# Patient Record
Sex: Male | Born: 1940 | Race: White | Hispanic: No | Marital: Married | Smoking: Former smoker
Health system: Southern US, Community
[De-identification: ages and names within clinical notes are randomized; demographics above are authoritative.]

## PROBLEM LIST (undated history)

## (undated) DIAGNOSIS — I350 Nonrheumatic aortic (valve) stenosis: Secondary | ICD-10-CM

## (undated) DIAGNOSIS — T7840XA Allergy, unspecified, initial encounter: Secondary | ICD-10-CM

## (undated) DIAGNOSIS — I471 Supraventricular tachycardia, unspecified: Secondary | ICD-10-CM

## (undated) DIAGNOSIS — K219 Gastro-esophageal reflux disease without esophagitis: Secondary | ICD-10-CM

## (undated) DIAGNOSIS — I4891 Unspecified atrial fibrillation: Secondary | ICD-10-CM

## (undated) DIAGNOSIS — K222 Esophageal obstruction: Secondary | ICD-10-CM

## (undated) DIAGNOSIS — E785 Hyperlipidemia, unspecified: Secondary | ICD-10-CM

## (undated) DIAGNOSIS — H409 Unspecified glaucoma: Secondary | ICD-10-CM

## (undated) DIAGNOSIS — M1990 Unspecified osteoarthritis, unspecified site: Secondary | ICD-10-CM

## (undated) DIAGNOSIS — I10 Essential (primary) hypertension: Secondary | ICD-10-CM

## (undated) DIAGNOSIS — K449 Diaphragmatic hernia without obstruction or gangrene: Secondary | ICD-10-CM

## (undated) DIAGNOSIS — H3320 Serous retinal detachment, unspecified eye: Secondary | ICD-10-CM

## (undated) DIAGNOSIS — C61 Malignant neoplasm of prostate: Secondary | ICD-10-CM

## (undated) DIAGNOSIS — R5383 Other fatigue: Secondary | ICD-10-CM

## (undated) DIAGNOSIS — Z9889 Other specified postprocedural states: Secondary | ICD-10-CM

## (undated) DIAGNOSIS — I447 Left bundle-branch block, unspecified: Secondary | ICD-10-CM

## (undated) DIAGNOSIS — I2510 Atherosclerotic heart disease of native coronary artery without angina pectoris: Secondary | ICD-10-CM

## (undated) DIAGNOSIS — I493 Ventricular premature depolarization: Secondary | ICD-10-CM

## (undated) DIAGNOSIS — C801 Malignant (primary) neoplasm, unspecified: Secondary | ICD-10-CM

## (undated) HISTORY — DX: Diaphragmatic hernia without obstruction or gangrene: K44.9

## (undated) HISTORY — DX: Atherosclerotic heart disease of native coronary artery without angina pectoris: I25.10

## (undated) HISTORY — DX: Left bundle-branch block, unspecified: I44.7

## (undated) HISTORY — DX: Allergy, unspecified, initial encounter: T78.40XA

## (undated) HISTORY — PX: HERNIA REPAIR: SHX51

## (undated) HISTORY — DX: Nonrheumatic aortic (valve) stenosis: I35.0

## (undated) HISTORY — DX: Hyperlipidemia, unspecified: E78.5

## (undated) HISTORY — DX: Other specified postprocedural states: Z98.890

## (undated) HISTORY — DX: Supraventricular tachycardia: I47.1

## (undated) HISTORY — PX: OTHER SURGICAL HISTORY: SHX169

## (undated) HISTORY — DX: Gastro-esophageal reflux disease without esophagitis: K21.9

## (undated) HISTORY — DX: Supraventricular tachycardia, unspecified: I47.10

## (undated) HISTORY — DX: Other fatigue: R53.83

## (undated) HISTORY — DX: Ventricular premature depolarization: I49.3

## (undated) HISTORY — PX: CARDIAC ELECTROPHYSIOLOGY STUDY AND ABLATION: SHX1294

## (undated) HISTORY — DX: Esophageal obstruction: K22.2

## (undated) HISTORY — DX: Unspecified osteoarthritis, unspecified site: M19.90

## (undated) HISTORY — DX: Malignant (primary) neoplasm, unspecified: C80.1

## (undated) HISTORY — DX: Malignant neoplasm of prostate: C61

---

## 1999-10-07 ENCOUNTER — Inpatient Hospital Stay (HOSPITAL_COMMUNITY): Admission: EM | Admit: 1999-10-07 | Discharge: 1999-10-09 | Payer: Self-pay | Admitting: Emergency Medicine

## 1999-10-07 ENCOUNTER — Encounter: Payer: Self-pay | Admitting: Neurology

## 1999-10-07 ENCOUNTER — Encounter: Payer: Self-pay | Admitting: Emergency Medicine

## 1999-10-08 ENCOUNTER — Encounter: Payer: Self-pay | Admitting: Emergency Medicine

## 1999-10-23 ENCOUNTER — Ambulatory Visit (HOSPITAL_COMMUNITY): Admission: RE | Admit: 1999-10-23 | Discharge: 1999-10-23 | Payer: Self-pay | Admitting: Cardiovascular Disease

## 2002-08-16 ENCOUNTER — Observation Stay (HOSPITAL_COMMUNITY): Admission: EM | Admit: 2002-08-16 | Discharge: 2002-08-16 | Payer: Self-pay | Admitting: Emergency Medicine

## 2003-07-28 ENCOUNTER — Ambulatory Visit (HOSPITAL_COMMUNITY): Admission: RE | Admit: 2003-07-28 | Discharge: 2003-07-28 | Payer: Self-pay | Admitting: Cardiology

## 2003-07-28 HISTORY — PX: CARDIAC CATHETERIZATION: SHX172

## 2003-07-29 ENCOUNTER — Emergency Department (HOSPITAL_COMMUNITY): Admission: EM | Admit: 2003-07-29 | Discharge: 2003-07-29 | Payer: Self-pay | Admitting: Emergency Medicine

## 2004-01-07 DIAGNOSIS — K449 Diaphragmatic hernia without obstruction or gangrene: Secondary | ICD-10-CM

## 2004-01-07 DIAGNOSIS — K222 Esophageal obstruction: Secondary | ICD-10-CM

## 2004-01-07 HISTORY — DX: Diaphragmatic hernia without obstruction or gangrene: K44.9

## 2004-01-07 HISTORY — DX: Esophageal obstruction: K22.2

## 2004-01-18 ENCOUNTER — Ambulatory Visit: Payer: Self-pay | Admitting: Gastroenterology

## 2004-01-29 ENCOUNTER — Ambulatory Visit: Payer: Self-pay | Admitting: Gastroenterology

## 2004-02-28 ENCOUNTER — Ambulatory Visit (HOSPITAL_COMMUNITY): Admission: RE | Admit: 2004-02-28 | Discharge: 2004-02-28 | Payer: Self-pay | Admitting: Surgery

## 2004-02-28 ENCOUNTER — Ambulatory Visit (HOSPITAL_BASED_OUTPATIENT_CLINIC_OR_DEPARTMENT_OTHER): Admission: RE | Admit: 2004-02-28 | Discharge: 2004-02-28 | Payer: Self-pay | Admitting: Surgery

## 2007-04-22 ENCOUNTER — Ambulatory Visit: Payer: Self-pay | Admitting: Vascular Surgery

## 2007-09-25 ENCOUNTER — Ambulatory Visit: Payer: Self-pay | Admitting: Cardiovascular Disease

## 2007-09-25 ENCOUNTER — Emergency Department (HOSPITAL_COMMUNITY): Admission: EM | Admit: 2007-09-25 | Discharge: 2007-09-26 | Payer: Self-pay | Admitting: Emergency Medicine

## 2008-01-27 ENCOUNTER — Inpatient Hospital Stay (HOSPITAL_BASED_OUTPATIENT_CLINIC_OR_DEPARTMENT_OTHER): Admission: RE | Admit: 2008-01-27 | Discharge: 2008-01-27 | Payer: Self-pay | Admitting: Cardiology

## 2008-01-27 HISTORY — PX: CARDIAC CATHETERIZATION: SHX172

## 2008-06-08 ENCOUNTER — Encounter: Admission: RE | Admit: 2008-06-08 | Discharge: 2008-06-08 | Payer: Self-pay | Admitting: Family Medicine

## 2008-10-11 ENCOUNTER — Encounter: Admission: RE | Admit: 2008-10-11 | Discharge: 2008-12-06 | Payer: Self-pay | Admitting: Orthopedic Surgery

## 2008-12-19 ENCOUNTER — Encounter (INDEPENDENT_AMBULATORY_CARE_PROVIDER_SITE_OTHER): Payer: Self-pay | Admitting: *Deleted

## 2009-08-20 ENCOUNTER — Ambulatory Visit: Payer: Self-pay | Admitting: Cardiology

## 2009-08-20 ENCOUNTER — Ambulatory Visit (HOSPITAL_COMMUNITY): Admission: RE | Admit: 2009-08-20 | Discharge: 2009-08-20 | Payer: Self-pay | Admitting: Cardiology

## 2009-08-20 HISTORY — PX: US ECHOCARDIOGRAPHY: HXRAD669

## 2009-08-21 ENCOUNTER — Telehealth: Payer: Self-pay | Admitting: Gastroenterology

## 2010-02-05 NOTE — Progress Notes (Signed)
Summary: Schedule Colonoscopy  Phone Note Outgoing Call Call back at [facility] Phone [phone]   Call placed by: Harlow Mares CMA Duncan Dull),  August 21, 2009 4:45 PM Call placed to: Patient Summary of Call: Left message on patients machine to call back. patient is due for a colonoscopy Initial call taken by: Harlow Mares CMA Duncan Dull),  August 21, 2009 4:45 PM  Follow-up for Phone Call        spoke to the patient and he will call back to schedule his colonoscopy, he has to arrange a driver.  Follow-up by: Harlow Mares CMA (AAMA),  August 27, 2009 10:08 AM

## 2010-02-27 ENCOUNTER — Ambulatory Visit (INDEPENDENT_AMBULATORY_CARE_PROVIDER_SITE_OTHER): Payer: 59 | Admitting: Cardiology

## 2010-02-27 ENCOUNTER — Encounter: Payer: Self-pay | Admitting: Cardiology

## 2010-02-27 DIAGNOSIS — I359 Nonrheumatic aortic valve disorder, unspecified: Secondary | ICD-10-CM

## 2010-02-27 DIAGNOSIS — I447 Left bundle-branch block, unspecified: Secondary | ICD-10-CM

## 2010-04-22 LAB — POCT I-STAT 3, VENOUS BLOOD GAS (G3P V)
Acid-Base Excess: 1 mmol/L (ref 0.0–2.0)
Bicarbonate: 27.1 mEq/L — ABNORMAL HIGH (ref 20.0–24.0)
O2 Saturation: 67 %
TCO2: 28 mmol/L (ref 0–100)
pCO2, Ven: 46.5 mmHg (ref 45.0–50.0)
pH, Ven: 7.373 — ABNORMAL HIGH (ref 7.250–7.300)
pO2, Ven: 36 mmHg (ref 30.0–45.0)

## 2010-04-22 LAB — POCT I-STAT 3, ART BLOOD GAS (G3+)
Bicarbonate: 24.2 mEq/L — ABNORMAL HIGH (ref 20.0–24.0)
TCO2: 25 mmol/L (ref 0–100)
pCO2 arterial: 35.4 mmHg (ref 35.0–45.0)
pH, Arterial: 7.443 (ref 7.350–7.450)

## 2010-04-29 ENCOUNTER — Other Ambulatory Visit: Payer: Self-pay | Admitting: Cardiology

## 2010-04-29 DIAGNOSIS — E785 Hyperlipidemia, unspecified: Secondary | ICD-10-CM

## 2010-04-29 MED ORDER — ROSUVASTATIN CALCIUM 5 MG PO TABS: ORAL_TABLET | ORAL | Status: DC

## 2010-04-29 NOTE — Telephone Encounter (Signed)
Patient request refill. Pt called to verify dose. escribed to [facility].  Alfonso Ramus RN

## 2010-04-29 NOTE — Telephone Encounter (Signed)
[facility] has notified him that he has reached his last refill of Crestor. Has faxed a patient request form here. I can't find his file.

## 2010-05-21 NOTE — Procedures (Signed)
CAROTID DUPLEX EXAM   INDICATION:  TIA.  Followup, carotid history.   HISTORY:  Diabetes:  No.  Cardiac:  No.  Hypertension:  No.  Smoking:  Quit over 25 years ago.  Previous Surgery:  No.  CV History:  Patient reports a general fatigue and an episode of  presyncope and vertigo a few weeks ago, which lasted five minutes.  Patient complains of left arm numbness at night.  Amaurosis Fugax No, Paresthesias Yes, Hemiparesis No                                       RIGHT             LEFT  Brachial systolic pressure:         120               114  Brachial Doppler waveforms:         Triphasic         Triphasic  Vertebral direction of flow:        Antegrade         Antegrade  DUPLEX VELOCITIES (cm/sec)  CCA peak systolic                   89                73  ECA peak systolic                   151               74  ICA peak systolic                   74                45  ICA end diastolic                   21                19  PLAQUE MORPHOLOGY:                  Calcified         Soft  PLAQUE AMOUNT:                      Mild              Mild  PLAQUE LOCATION:                    Proximal ICA      Proximal ICA   IMPRESSION:  20-39% internal carotid artery stenosis bilaterally.   ___________________________________________  Larina Earthly, M.D.   MC/MEDQ  D:  04/22/2007  T:  04/22/2007  Job:  [id number]

## 2010-05-21 NOTE — H&P (Signed)
Jose Petty, Jose Petty                    ACCOUNT NO.:  [id number]   MEDICAL RECORD NO.:  [id number]          PATIENT TYPE:  OUT   LOCATION:                               FACILITY:  [facility]   PHYSICIAN:  Colleen Can. Deborah Chalk, M.D.DATE OF BIRTH:  October 01, 1940   DATE OF ADMISSION:  01/27/2008  DATE OF DISCHARGE:                              HISTORY & PHYSICAL   CHIEF COMPLAINT:  Persistent malaise and fatigue.   HISTORY OF PRESENT ILLNESS:  Jose Petty is a 70 year old white male who is  referred for diagnostic cardiac catheterization.  He has had an episode  of ventricular tachycardia with frequent PVCs and underwent a  ventricular tachycardia ablation at [facility] on November 28, 2008.  Following the ablation, there was concern of an abnormal ejection  fraction.  He underwent a stress echocardiogram on December 27, 2008.  With this, he exercised a total of 6 minutes to a maximum of 3.5 miles  per hour at 12% grade.  His heart rate rose to 152 and blood pressure  increased to 160/70.  The test was stopped due to fatigue.  He was  fatigued after 3 minutes of exercise and really had poor exercise  tolerance.  His EKG shows scattered PVCs at rest.  He had no couplets.  He did have a left bundle-branch block and IV conduction delay noted.  With exercise, he did develop slight ST-segment changes in V5 and V6.  His echocardiographic findings demonstrated a mild decrease in distal  septal motion.  With exercise, he had worsening of this area of  function.  His overall global ejection fraction remained in the 45-50%  range.  In light of these findings as well as a known aortic valve area  of 1.4 cm. sq. with a gradient of approximately 15 mm, he is now  referred for cardiac catheterization.  Clinically, he has not had any  complaints of chest pain.   PAST MEDICAL HISTORY:  1. 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 Clydie Petty on November 23, 69.  2. Persistent malaise and fatigue.  3. History of removal of a lipoma.  4. History of tachycardia in 2004.  5. History of cardiac catheterization in 2005 showing normal LV      function and insignificant coronary artery disease.   ALLERGIES:  None.   CURRENT MEDICATIONS:  Aspirin daily.   FAMILY HISTORY:  His father died at 30 with a heart attack.  His mother  lived up into her 5s.  He has 2 brothers, 1 has had bypass surgery.   SOCIAL HISTORY:  He has been employed as a Product/process development scientist.  He lives  at home with his wife.  He has 2 grown sons.  He does not have any  alcohol or tobacco use.   REVIEW OF SYSTEMS:  His malaise and fatigue continues to persist.  He  has had no chest pain.  He is usually not short of breath.  He does have  poor exercise tolerance documented.  Since his ablation, he has had a  couple of episodes where he thought his heart rate was a little fast as  well as his blood pressure.  He has had no dizziness and no syncope and  all other review of systems are negative.   PHYSICAL EXAMINATION:  GENERAL:  He is a pleasant white male who is in  no acute distress. Alert and conversive.  VITAL SIGNS:  His weight is 174 pounds, blood pressure 118/68, heart  rate 72 and regular, and respirations 80.  He is afebrile.  HEENT:  Normocephalic and atraumatic. Pupils are round. Conjunctiva  normal.  NECK:  Supple.  No JVD. No adenopathy.  SKIN:  Warm and dry.  Color is unremarkable.  LUNGS:  Clear. Not short of breath.  HEART:  Regular rhythm. No murmur.  ABDOMEN: Soft, positive bowel sounds, and nontender.  EXTREMITIES:  Without edema.  M/S: Gait steady. ROM intact.  NEUROLOGIC:  No gross focal deficits.   PERTINENT LABORATORY DATA:  Pending.   OVERALL IMPRESSION:  1. Abnormal stress echocardiogram.  2. Persistent weakness, fatigue, and malaise.  3. Recent of ventricular tachycardia ablation.  4. Decreased  ejection fraction.   PLAN:  We will proceed on with diagnostic cardiac catheterization.  The  risks, procedure, and benefits have been reviewed and the patient is  willing to proceed on Thursday, January 27, 2008.      Sharlee Blew, N.P.      Colleen Can. Deborah Chalk, M.D.  Electronically Signed    LC/MEDQ  D:  01/21/2008  T:  01/22/2008  Job:  [id number]

## 2010-05-21 NOTE — Cardiovascular Report (Signed)
NAMEMESSI, TWEDT                    ACCOUNT NO.:  [id number]   MEDICAL RECORD NO.:  [id number]          PATIENT TYPE:  OIB   LOCATION:  1961                         FACILITY:  [facility]   PHYSICIAN:  Colleen Can. Deborah Chalk, M.D.DATE OF BIRTH:  [birth year]   DATE OF PROCEDURE:  01/27/2008  DATE OF DISCHARGE:  01/27/2008                            CARDIAC CATHETERIZATION   HISTORY:  Jose Petty is a 70 year old male with a history of frequent PVCs  and status post ventricular tachycardia who placed in [facility]  on November 2009.  He had persistent malaise and fatigue and has been  history of catheterization 2005 showed normal LV function and  insignificant coronary artery disease.  One brother has had bypass  surgery.  Because of the persistent symptoms and mild aortic valve  gradient, he is referred for cardiac catheterization.   PROCEDURE:  Right and left heart catheterization with selective coronary  angiography and left ventricular angiography.   TYPE AND SITE OF ENTRY:  Percutaneous right femoral artery, percutaneous  right femoral artery vein.   CATHETERS:  A 7-French Swan-Ganz thermodilution catheter, 5-French four  curved Judkins right and left coronary catheters, 5-French pigtail  ventriculographic catheter.   CONTRAST MATERIAL:  Omnipaque.   MEDICATIONS:  Given prior to procedure, Valium 10 mg p.o.   MEDICATIONS:  Given during the procedure, Versed 2 mg IV.   COMMENTS:  The patient tolerated the procedure well.   HEMODYNAMIC DATA:  The right atrium showed an A-wave of 10, V-wave of 8,  mean of 7, RV was 25/7-12.  Pulmonary artery was 28/7-18.  Pulmonary  capillary wedge pressure was A-wave 15, V-wave of 12, mean of 9, aortic  pressure was 125/70, LV was 127/13.  There was less than 5 mm peak mean  gradient across the aortic valve.  The Fick cardiac output was 4.1,  thermodilution cardiac output was 3.2 with a cardiac index 1.7.  The  Fick cardiac index was 2.2.   ANGIOGRAPHIC DATA:  1. The left main coronary artery was normal.  2. Left anterior descending has mild proximal narrowing at the ostium      of approximately 30%.  There was a high diagonal vessel that arose      almost an intermediate location.  There was scattered      irregularities in the left anterior descending otherwise.  3. Left circumflex was essentially normal.  4. The right coronary artery was very tortuous proximally.  There      appeared to be 20-30% narrowing.  There was scattered      irregularities proximal right coronary artery.  It was a dominant      vessel.   The left ventricular angiogram was performed in the RAO projection.  Overall cardiac size was normal, mild global hypokinesis in the  generalized fashion with an estimated ejection fraction of 45%.  There  was no mitral regurgitation, intracardiac calcification, or  intracavitary filling defect.   OVERALL IMPRESSION:  1. There is mild coronary atherosclerosis with 30% proximal left      anterior descending stenosis, 20% narrowing  tortuous right coronary      artery, which is normal right circumflex.  2. Mild global reduction of left ventricular ejection fraction      estimated to be approximately 45% with borderline low cardiac      outputs.  3. Normal pulmonary artery pressures.  4. Periods of apneic with breathing with sedation during the procedure      and loud snoring present otherwise.   DISCUSSION:  It is felt that Jose Petty's problem is not related to coronary  artery disease or it is aortic valve disease at this point in time.  We  will make recommendations according to have exercise testing with  oximetry and measurement of O2 consumption to try to sort out exact  etiologies of his persistent symptomatology.      Colleen Can. Deborah Chalk, M.D.  Electronically Signed     SNT/MEDQ  D:  01/27/2008  T:  01/28/2008  Job:  [id number]   cc:   Mick Sell, MD

## 2010-05-21 NOTE — Consult Note (Signed)
Jose Petty, Jose Petty NO.:  [id number]   MEDICAL RECORD NO.:  [id number]          PATIENT TYPE:  EMS   LOCATION:  [facility]                         FACILITY:  [facility]   PHYSICIAN:  Christell Faith, MD   DATE OF BIRTH:  August 06, 1940   DATE OF CONSULTATION:  09/25/2007  DATE OF DISCHARGE:                                 CONSULTATION   CARDIOLOGIST:  Colleen Can. Deborah Chalk, MD   CHIEF COMPLAINT:  Lightheadedness and neck tightness.   HISTORY OF PRESENT ILLNESS:  This is a 70 year old white man with  approximately a 10-year history of paroxysmal tachycardia.  He has seen  Dr. Graciela Husbands in the past who recommended EP study, however, the patient was  not interested at that time.  It has been presumed that he has SVT.  However, the patient has been wearing a heart monitor for about the past  week to try to further characterize and document his rhythms.  Today,  the patient transmitted several minutes of wide complex tachycardia at  approximately 7:30 p.m., which began while he was sitting at the  computer.  This was associated with lightheadedness and tightness and  fullness in the neck.  It lasted for several minutes and terminated with  25 mg of p.o. Lopressor.  [facility] recommended that the patient come to  the emergency department and he now presents here symptom-free.  The  patient is very skeptical of the diagnosis of tachycardia because he did  not actually feel his heart racing and he says in the past he has felt  his heart racing.  He is adamantly declining admission to the hospital.   PAST MEDICAL HISTORY:  1. Catheterization in July 2005 showed minimal insignificant coronary      artery disease and an ejection fraction of 60%.  2. Paroxysmal SVT, has been seen by Dr. Graciela Husbands after developing QRS      widening on a treadmill test.  3. Hyperlipidemia.  4. GERD.  5. Former cigarette smoker.  6. Obstructive sleep apnea.   SOCIAL HISTORY:  He lives in [place] with his  wife.  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  He has 1 caffeinated drink  a day.  He quit smoking and does not use drugs.   FAMILY HISTORY:  Father died at age 100 with complications of coronary  artery disease.   REVIEW OF SYSTEMS:  Positive for shortness of breath and presyncope  today and positive for chronic indigestion.   ALLERGIES:  None.   MEDICATIONS:  Nexium, Crestor, TriCor, aspirin, and p.r.n., Lopressor,  he is unsure of the doses.   PHYSICAL EXAMINATION:  VITAL SIGNS:  Temperature 97.6, pulse 76,  respiratory rate 18, blood pressure 143/85, and saturation 99% on room  air.  GENERAL:  This is a pleasant but somewhat argumentative white man in no  acute distress.  Several things he says makes me question mild early  dementia, although he is clearly able to understand what I am telling  and is competent to make his own decisions.  HEENT:  Pupils are equal, round, and reactive.  Sclerae are clear.  NECK:  Supple.  Dentition is good.  Neck veins are flat.  No carotid  bruits.  LUNGS:  Clear to auscultation bilaterally without wheezes or rales.  CARDIAC:  Normal rate and regular rhythm with frequent PVCs.  There is a  2/6 systolic ejection murmur at the base of the heart.  ABDOMEN:  Soft, nontender, and nondistended.  EXTREMITIES:  No clubbing, cyanosis, or edema.  MUSCULOSKELETAL:  No joint effusions or deformities.  NEUROLOGIC:  Facial expressions are symmetric and intact.  A 5/5  strength in all 4 extremities.   DIAGNOSTIC TESTS:  Chest x-ray shows mild cardiomegaly without edema.  EKG shows sinus rhythm at 65 beats a minute with frequent PVCs and a QTC  of 460 milliseconds.  CardioNet transmission shows wide complex  tachycardia at 190 beats per minute, regular and lasting several  minutes.  It is of a different morphology than his frequent PVCs.  White  blood cell 6.5, hemoglobin 14.9, and platelets 184.  Sodium 140,  potassium 3.8, BUN 18,  creatinine 0.9, and glucose 106.  CK-MB 2.2.  Troponin less than 0.05.   IMPRESSION:  This is a 70 year old white man with a wide complex  tachycardia today associated with lightheadedness and tightness in his  neck.   PLAN:  Cannot exclude ventricular tachycardia based on the strips.  I  discussed my recommendations with the patient and his wife that he come  into the hospital overnight for observation and evaluation by an  arrhythmia specialist.  I discussed with him his risk of sudden cardiac  death, heart attack, and passing out.  We discussed this at length and  the patient adamantly refuses to come into the hospital.  His wife  reluctantly agrees with the patient.  They both state that he will  follow up on Monday with Dr. Deborah Chalk.  I advised them to take his  Lopressor twice daily as opposed to p.r.n. and I also offered to him  that if he changes his mind or if his symptoms recur that he can come  back and we would be happy to admit him.  I discussed with them I am  concerned about the tightness in his neck and throat as possibly  representing angina and we did discuss the risk of heart attack and all  other adverse outcomes and the patient clearly has his mind make up to  go home.      Christell Faith, MD  Electronically Signed     NDL/MEDQ  D:  09/26/2007  T:  09/26/2007  Job:  [id number]

## 2010-05-24 NOTE — H&P (Signed)
[name], [name]                              ACCOUNT NO.:  [id number]   MEDICAL RECORD NO.:  [id number]                   PATIENT TYPE:  OBV   LOCATION:                                       FACILITY:  [facility]   PHYSICIAN:  Quita Skye. Waldon Reining, MD             DATE OF BIRTH:  06-08-1940   DATE OF ADMISSION:  08/16/2002  DATE OF DISCHARGE:                                HISTORY & PHYSICAL   HISTORY OF PRESENT ILLNESS:  Jose Petty is a 70 year old white man who was  admitted to [facility] because of borderline cardiac enzymes after  experiencing an episode of supraventricular tachycardia.   The patient has a history of paroxysmal supraventricular tachycardia.  He  experienced an episode today while driving home from work.  He felt a sudden  onset of a rapid heart beat.  This was associated with dizziness,  lightheadedness, and diaphoresis.  There was no chest pain, pressure,  tightness, and squeezing.  EMS was summoned and he converted to normal sinus  rhythm after the first dose of adenosine.  He was transported to the  emergency department where he has remained asymptomatic and in normal sinus  rhythm.   PAST MEDICAL HISTORY:  The patient has no history of myocardial infarction  or congestive heart failure.  He did undergo cardiac catheterization in  2001.  This demonstrated normal left ventricular function with a 20% left  main lesion, a 30% small first obtuse marginal lesion, and a 20% right  coronary artery lesion.   The patient has a history of hyperlipidemia, which is currently under  treatment.  There is no history of hypertension, smoking, diabetes mellitus,  or family history of early coronary artery disease.   SOCIAL HISTORY:  The patient lives with his wife.  He is employed as a  Product/process development scientist.   FAMILY HISTORY:  Family history is noncontributory.   ALLERGIES:  The patient is not allergic to any medications.   CURRENT MEDICATIONS:  His current  medications include Lipitor and  metoprolol.   REVIEW OF SYSTEMS:  Review of systems reveals no new problems related to his  head, eyes, ears, nose, mouth, throat, lungs, gastrointestinal system,  genitourinary system, or extremities.  There is no history of neurologic or  psychiatric disorder.  There is no history of fever, chills, or weight loss.   PHYSICAL EXAMINATION:  VITAL SIGNS:  Blood pressure 105/68.  Pulse 72 and  regular.  Respirations 12.  Temperature 97.8.  GENERAL:  The patient was a middle-aged white man in no discomfort.  He was  alert, oriented, appropriate, and responsive.  HEENT:  Head, eyes, nose, and mouth were normal.  NECK:  The neck was without thyromegaly or adenopathy.  Carotid pulses were  palpable bilaterally and without bruits.  CARDIAC:  Examination reveals a normal S1 and S2.  There is no S3,  S4,  murmur, rub, or click.  Cardiac rhythm was regular.  No chest wall  tenderness was noted.  LUNGS:  The lungs were clear.  ABDOMEN:  The abdomen was soft and nontender.  There was no mass,  hepatosplenomegaly, bruit, distention, rebound, guarding, or rigidity.  Bowel sounds are normal.  RECTAL AND GENITAL:  Examinations were not performed as they were not  pertinent as to the reason for acute care hospitalization.  EXTREMITIES:  The extremities were without edema, deviation, or deformity.  Radial and dorsalis pedal pulses were palpable bilaterally.  NEUROLOGIC:  Brief screening neurologic survey was unremarkable.   LABORATORY AND ACCESSORY CLINICAL DATA:  The post-conversion  electrocardiogram revealed normal sinus rhythm, a rightward axis, and a  mildly prolonged Q-T interval.  The tracing was otherwise normal.   The chest radiograph was pending at the time of this dictation.   The initial set of cardiac enzymes revealed a CK-MB of 5.4, myoglobin of  447, and troponin 0.09.  A second set revealed a CK-MB of 6.2, myoglobin  367, and troponin 0.48.  Potassium  was 3.7, BUN 20, and creatinine 1.6.  The  remaining studies were pending at the time of this dictation.   IMPRESSION:  1. Supraventricular tachycardia, resolved with Adenocard; borderline cardiac     enzymes; rule out acute myocardial infarction.  2. History of paroxysmal supraventricular tachycardia.  3. Hyperlipidemia.  4. Nonobstructive coronary artery disease by cardiac catheterization in     2001; 20% left main lesion, 30% small first obtuse marginal lesion, and     20% right coronary artery lesion.   PLAN:  1. Telemetry.  2. Serial cardiac enzymes.  3. Aspirin.  4. Nitrol paste.  5. Continue metoprolol.  6. Lovenox.  7. Cardiac evaluation per Dr. Nicki Guadalajara.                                                Quita Skye. Waldon Reining, MD    MSC/MEDQ  D:  08/16/2002  T:  08/16/2002  Job:  [id number]

## 2010-05-24 NOTE — H&P (Signed)
Jose Petty, Jose Petty                              ACCOUNT NO.:  [id number]   MEDICAL RECORD NO.:  [id number]                   PATIENT TYPE:  OIB   LOCATION:                                       FACILITY:  [facility]   PHYSICIAN:  Colleen Can. Deborah Chalk, M.D.            DATE OF BIRTH:  [birth year]   DATE OF ADMISSION:  07/28/2003  DATE OF DISCHARGE:                                HISTORY & PHYSICAL   CHIEF COMPLAINT:  Somnolence and fatigue with a subsequent abnormal stress  echocardiogram with widening of the QRS complex in a non-specific manner  that Petty felt to be rate related;  however, with lateral borderline ST  segment depression, echocardiographically abnormal with distal septal and  apical hypokinesia relative to the resting images.   HISTORY OF PRESENT ILLNESS:  Jose Petty a 70 year old male who has a long-  standing history of tachycardia.  He has had a past history of  catheterization dating back to 2001, which showed non-obstructive disease.  He has had a history of paroxysmal SVT and has been seen and evaluated by  Dr. Sherryl Manges and subsequently switched over to propranolol.  He  presented to the office for his routine followup towards the first part of  July with concerns of having more fatigue and considerable daytime  somnolence.  There was a lengthy discussion about the possibility of sleep  apnea as well.  The patient subsequently underwent stress echocardiogram  which was performed on July 18, 2003.  With this, he demonstrated fairly  good exercise tolerance with an adequate blood pressure response.  The EKG  was equivocal with widening of the QRS complex in a non-specific pattern;  however, there was lateral borderline ST segment depression.  The  echocardiographic images demonstrated distal septal and apical hypokinesia  relative to the resting images, and in light of these findings it Petty felt it  Petty best to proceed on with cardiac catheterization.  Clinically, he has  had  no chest pain.   PAST MEDICAL HISTORY:  1. Paroxysmal SVT.  2. Past history of cardiac catheterization in 2001, showing non-obstructive     coronary disease.  3. Chronic fatigue.  4. Hyperlipidemia.  5. Gastroesophageal reflux disease.  6. History of calcified aortic valve per recent echocardiogram in July 2005.  7. History of a lipoma removal.  8. Remote tobacco use.   ALLERGIES:  No known drug allergies.   CURRENT MEDICATIONS:  1. Lipitor 40 mg daily.  2. Nexium daily.  3. Baby aspirin daily.  4. Propranolol 80 mg daily.   FAMILY HISTORY:  His father died at 72 with heart disease.  Mother Petty alive  in her 75s.   SOCIAL HISTORY:  He Petty married.  He has had no tobacco products for the past  15 years.  He has rare alcohol use.   REVIEW OF SYSTEMS:  As noted  above and Petty otherwise unremarkable.   PHYSICAL EXAMINATION:  GENERAL:  He Petty a pleasant middle-aged white male in  no acute distress.  VITAL SIGNS:  Weight Petty 181 pounds, blood pressure 130/80 sitting, 120/80  standing, heart rate Petty 56, respirations 18, he Petty afebrile.  SKIN:  Warm and dry.  Color Petty unremarkable.  LUNGS:  Basically clear.  HEART:  Regular rhythm.  ABDOMEN:  Soft, positive bowel sounds, nontender.  EXTREMITIES:  Without edema.  NEUROLOGIC:  Intact.  There are no gross focal deficits.   LABORATORY DATA:  Pending.   IMPRESSION:  1. Abnormal stress echocardiogram.  2. Weakness and fatigue of unknown etiology.  3. Past history of catheterization showing non-obstructive coronary disease.  4. History of paroxysmal supraventricular tachycardia, stable with beta     blocker therapy.  5. Hyperlipidemia.   PLAN:  We will proceed on with elective cardiac catheterization.  The  procedure has been discussed in full detail, and he Petty willing to proceed on  Friday, July 28, 2003.      Sharlee Blew, N.P.                     Colleen Can. Deborah Chalk, M.D.    LC/MEDQ  D:  07/21/2003  T:  07/21/2003   Job:  [id number]   cc:   Olene Craven, M.D.  [address]  [address]  [place]  [place] [address]  Fax: [phone]

## 2010-05-24 NOTE — Cardiovascular Report (Signed)
NAMEBROOK, MALL                              ACCOUNT NO.:  [id number]   MEDICAL RECORD NO.:  [id number]                   PATIENT TYPE:  OIB   LOCATION:  2899                                 FACILITY:  [facility]   PHYSICIAN:  Colleen Can. Deborah Chalk, M.D.            DATE OF BIRTH:  02/14/1940   DATE OF PROCEDURE:  07/28/2003  DATE OF DISCHARGE:                              CARDIAC CATHETERIZATION   HISTORY:  Mr. Carcamo has had fatigue.  He had a stress echo which showed  definite distal septal and apical hypokinesia.  He developed a rate related  left bundle branch block.   PROCEDURE:  1. Left heart catheterization with selective coronary angiography.  2. Left ventricular angiography.   TYPE AND SITE OF ENTRY:  Percutaneous, right femoral artery.   CATHETERS:  A 6 French 4 curved Judkins right and left coronary catheters; 6  French pigtail ventriculographic catheter.   CONTRAST MATERIAL:  Omnipaque.   COMMENTS:  AngioSeal was used to close the arteriotomy site.  Ancef 1 gm IV  was given.   HEMODYNAMIC DATA:  1. The aortic pressure was 115/60.  2. LV was 118/6-11.   ANGIOGRAPHIC DATA:  1. His left main coronary artery was normal.  2. The left circumflex is a moderately large vessel continues as a     posterolateral branch.  It is normal.  3. Intermediate coronary.  There is a small intermediate coronary.  4. Left anterior descending.  The left anterior descending has ostial     irregularities.  There is mild calcification proximally.  It is normal     otherwise.  5. Right coronary artery: The right coronary artery is tortuous. There is     calcification near the ostium; it probably does involve the aorta at that     site. It has minor irregularities it is normal otherwise.   LEFT VENTRICULAR ANGIOGRAM:  A left ventricular angiogram was performed in  the RAO projection.  Overall cardiac size and silhouette are normal.  His  global ejection fraction is 60%.   OVERALL IMPRESSION:  1. Normal left ventricular function.  2. Minimal and insignificant coronary artery disease.   DISCUSSION:  The patient received Valium and Versed during the procedure.  He then developed sedation and began to snore.  Oxygen saturations were  monitored and he would drop from 98 down into the mid-70s before he began to  adequately oxygenate.  He would continue to have a snoring sound, but was  not moving air well and would have a decrease in his O2 saturations.   It was felt that Mr. Allende does have normal coronary arteries, and normal left  ventricular function, but almost certainly the etiology of his symptoms will  be related to obstructive sleep apnea.  Colleen Can. Deborah Chalk, M.D.    SNT/MEDQ  D:  07/28/2003  T:  07/30/2003  Job:  [id number]

## 2010-05-24 NOTE — Op Note (Signed)
Jose Petty, Jose Petty                    ACCOUNT NO.:  [id number]   MEDICAL RECORD NO.:  [id number]          PATIENT TYPE:  AMB   LOCATION:  DSC                          FACILITY:  [facility]   PHYSICIAN:  Currie Paris, M.D.DATE OF BIRTH:  June 21, 1940   DATE OF PROCEDURE:  02/28/2004  DATE OF DISCHARGE:                                 OPERATIVE REPORT   Office MR# [id number].   PREOPERATIVE DIAGNOSIS:  Right inguinal hernia, probably direct.   POSTOPERATIVE DIAGNOSIS:  Direct right inguinal hernia.   OPERATION/PROCEDURE:  Repair with mesh.   SURGEON:  Currie Paris, M.D.   ASSISTANT:  Kizzie Furnish, P.A.-C.   ANESTHESIA:  General.   CLINICAL HISTORY:  This patient is a 70 year old with a right inguinal  hernia he desired to have repaired.   DESCRIPTION OF PROCEDURE:  The patient was seen in the holding area and he  had no further questions.  The right inguinal area was marked by the patient  and initialed by me as well confirming the operative site.   He was taken to the operating room and after satisfactory general  anesthesia, the inguinal area was clipped, prepped and draped.  A time out  occurred.   The area was anesthetized with 0.5% plain Marcaine to help with  postoperative analgesia.  Incision was made and deepened to the external  oblique aponeurosis.  Additional local was infiltrated as we went deeper.  The aponeurosis was opened in line with its fibers into the superficial ring  and elevated off the cord structures.  The cord was dissected off the  inguinal floor and surrounded with a Penrose drain.  There was a direct  hernia present which was stripped off the back side of the cord and reduced.  It involved basically the entire inguinal floor.  The cord was carefully  dissected and there was no evidence of any indirect sac.   A large mesh plug was placed into the defect.  It was secured with several  sutures of 2-0 Prolene suturing to the tubercle in Cooper's  and then to the  shelving edge of the external oblique and then underneath to the  transversalis medially.  The mesh patch was overlayed and sutured with a  running suture inferiorly with a tail split to go around the cord and then  tacked to the internal oblique medially and superiorly.  The tails went well  lateral to reconstruct the deep ring.   Everything appeared to be dry.  Additional local had been infiltrated.  The  incision was closed with 3-0 Vicryl and Scarpa's fascia, 3-0 Vicryl on  external oblique and 4-0 Monocryl subcuticular with Dermabond.  The patient  tolerated the procedure well.  There were no operative complications.  All  counts were correct.      CJS/MEDQ  D:  02/28/2004  T:  02/28/2004  Job:  [id number]   cc:   Olene Craven, M.D.  [address]  [address]  [place]  [place] [address]  Fax: [phone]   Colleen Can. Deborah Chalk, M.D.  Fax:  [phone] 

## 2010-05-24 NOTE — Discharge Summary (Signed)
[place]. [facility]  Patient:    Jose Petty, Jose Petty                           MRN: [id number] Adm. Date:  [id number] Disc. Date: [id number] Attending:  Silvestre Mesi Dictator:   Donzetta Matters, P.A.C.                           Discharge Summary  PRINCIPAL DIAGNOSES ON DISCHARGE: 1. Paresthesias. 2. Abnormal EKG.  PROCEDURES DURING HOSPITALIZATION: 1. MRI and MRA of the brain. 2. CT of the brain. 3. Carotid Dopplers. 4. A 2D echocardiogram.  CONSULTS: 1. Marlan Palau, M.D., neurology. 2. Deanna Artis. Sharene Skeans, M.D., neurology.  CONDITION ON DISCHARGE:  Stable.  BRIEF HISTORY OF HOSPITALIZATION: This 70 year old male who was admitted through the office with hyperesthesias, paresthesias, states his left arm was like he was asleep.  He has been sent from Onalee Hua B. Georgina Pillion, M.D.s, office due to a slow heart rate.  EKG in the office showed a heart rate of 37.  He was then placed in the hospital on telemetry with rates running in the 50s and 60s.  He has also had episodes of rapid heart rate in the past that caused weakness and dizziness. This was relieved with lying down with his feet up. He does have positive family history of father dying of an MI at age 36, a brother with coronary artery bypass grafting at age 72.  He had been regularly on aspirin twice a day and had been taking this for over two years.  EKG showed normal sinus rhythm with occasional PVCs with abnormalities.  Telemetry continued to show sinus rhythm at rate of 70.  On admission blood pressure was stable.  Physical examination was essentially normal with normal PMI on cardiac examination.  Plavix was added. Cardiac enzymes were obtained which were negative x 2.  Neurology consult requested which suggested possible TIA versus small vessel disease.  He has undergone CT of the head without contrast which returned as negative.  Also carotid Dopplers were obtained which showed some minimal  calcific plaque in the bifurcation bilaterally but no evidence of ICA stenosis and ventricular flow was antegrade. MRI and MRA of the brain also were negative with no acute changes.  The 2D echocardiogram preliminary results were negative.  He was then followed up by neurology who suggested continuing with the Plavix 75 as well as aspirin 325 but has little confidence that this would improve his symptoms or prevent a small vessel stroke.  Today he had no specific symptoms of congestive heart failure.  His cardiac examination was normal with regular rate and rhythm.  Normal PMI, normal S1 and S2.  Heart rate was 71.  Blood pressure was 111/65.  Oxygen saturation was 94% on room air and respiratory rate was 20.  It was felt that he was stable enough for discharge home with further outpatient work-up.  Further work-up should include exercise treadmill tolerance test which will be scheduled at the office.  LABORATORY DATA DURING HOSPITALIZATION:  CBC on admission showed white count of 10,500, hemoglobin 14.8, hematocrit 40.2, platelet count 220 with 59% neutrophils.  Initial total CK was 146, CK-MB of 1.2, relative index 1.2, troponin I of 0.01.  Protime normal at 12.4 with INR of 0.9.  PTT was followed while the patient was on heparin.  Electrolytes on admission showed sodium of 140,  potassium 4.1, chloride 107, BUN 15, and glucose 95.  Repeat total CK was 107, CK-MB of 1.9, relative index 1.8.  Creatinine on admission was 1.2 which was normal.  Labs today did show white count 12,000, hemoglobin 14.6, hematocrit 41.2, platelet count 202.  It was felt that he was stable enough to be discharged home.  DISCHARGE MEDICATIONS: 1. Enteric coated aspirin 325 mg once daily. 2. Plavix 75 mg once daily.  ACTIVITY:  No restrictions.  DIET:  Low cholesterol.  WOUND CARE:  Not applicable.  FOLLOW-UP:  He is to follow up with Aram Candela. Tysinger, M.D., in the office and call for an appointment.  Also  have exercise treadmill tolerance test in two to three weeks and will follow up with Dr. Sharene Skeans if symptoms do increase. DD:  10/09/99 TD:  10/09/99 Job: [id number]

## 2010-05-24 NOTE — Cardiovascular Report (Signed)
[place]. [facility]  Patient:    RYZEN, DEADY                           MRN: [id number] Proc. Date: 10/23/99 Adm. Date:  [id number] Attending:  Virgina Evener CC:         Deanna Artis. Sharene Skeans, M.D.             Oley Balm Georgina Pillion, M.D.                        Cardiac Catheterization  INDICATIONS:  Mr. 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  An ECG suggested the possibility of an old anteroseptal MI.  A subsequent two-dimensional echocardiogram Doppler study suggested severe hypocontractility of the mid distal septal wall. Subsequently, an exercise Cardiolite study showed normal perfusion with the exception of an area of apical inferolateral possible ischemia.  The patient is now referred for definitive cardiac catheterization.  HEMODYNAMIC DATA:  Central aortic pressure 150/78, left ventricular pressure 150/23.  During the procedure, the patient was having frequent isolated PVCs with trigeminy and quadrigeminy.  ANGIOGRAPHIC DATA:  Left main coronary artery had mild calcification.  There was 20% ostial to mid narrowing followed by a small area of ectasia.  The LAD was angiographically normal and gavie rise to a proximal septal perforating artery and several small diagonal vessels.  The mid and distal LAD seemed to dip intramyocardially.  The distal LAD extended around the apex.  The circumflex vessel was a moderate size vessel that gave rise to two small marginal vessels.  The first marginal vessel had 30% narrowing on a bend of the vessel.  The right coronary artery was a moderate size vessel.  There was 20% focal narrowing proximal to the acute margin.  Biplane cine left ventriculography revealed preserved global LV function. When the patient did have ectope, there was dysenergy of the mid distal septum as well as  distal anterolateral wall suggesting hypokinesis in this region. However, with the normal beats, function was improved.  IMPRESSION: 1. Preserved global left ventricular function with intermittent septal    dysenergy secondary to ventricular ectope. 2. Minimal coronary artery disease with minimal calcification involving the    left main with 20% ostial to mid narrowing, 30% narrowing in a small    OM1 branch of the circumflex vessel, and 20% narrowing in the right    coronary artery.  In addition, the mid-distal portion of the LAD dips    intramyocardially.  RECOMMENDATION:  Medical therapy. DD:  10/23/99 TD:  10/23/99 Job: [id number]

## 2010-05-24 NOTE — Consult Note (Signed)
[place]. [facility]  Patient:    Jose Petty, Jose Petty                           MRN: [id number] Proc. Date: 10/07/99 Adm. Date:  [id number] Attending:  Silvestre Mesi CC:         Oley Balm. Georgina Pillion, M.D.  John R. Aleen Campi, M.D.  [facility]   Consultation Report  HISTORY OF PRESENT ILLNESS:  Jose Petty is a 70 year old right-handed white male born 09-11-1940, with a history of episodes of left hemisensory deficits. The patient has begun noting the events that began two days ago, never experienced before this time.  The patient has had episodes of mainly left hand and arm paresthesias, also involving the left foot at times, and also the left mouth as well.  The patient denies any visual field changes, definite headache, although does complain of a mild achy sensation to the head at this time.  The patient again denies any vision changes, speech changes, weakness, or clumsiness on the face, arms, or legs.  The patient has not had any alteration in his ability to ambulate.  The patient has had episodes of visual field impairment of the right eye in the 1970s, but this has not recurred. The cause of this  was not known.  The patient comes to the hospital today for an evaluation.  PAST MEDICAL HISTORY: 1. Episodic left hemisensory deficits lasting seconds to one to two minutes,    rule out TIA event. 2. History of lipoma resection on the right neck. 3. History of left wrist surgery.  MEDICATIONS:  Aspirin one twice a day.  SOCIAL HISTORY:  The patient states that he does not smoke, drinks alcohol on occasion.  The patient is married, lives in the [place] area.  Has two children, who are alive and well.  The patient works as a Product/process development scientist.  ALLERGIES:  No known allergies.  FAMILY HISTORY:  Mother is alive, has senile dementia of the Alzheimers type. Father died with MI.  The patient has two brothers, alive and well.  One brother  has had a CABG procedure.  REVIEW OF SYSTEMS:  Notable for no fevers, chills.  The patient does note occasional headache.  Denies neck pain.  Denies shortness of breath, chest pain, nausea or vomiting, problems controlling the bowels or bladder.  The patient denies any blackout episodes or seizures.  PHYSICAL EXAMINATION:  VITAL SIGNS:  Blood pressure is 136/85, heart rate is 73, respiratory rate 18, temperature afebrile.  GENERAL:  This patient is a well-developed, well-nourished white male who is alert and cooperative at the time of the exam.  HEENT:  Head is atraumatic.  Eyes:  Pupils are equal, round and reactive to light.  Discs soft and flat bilaterally.  NECK:  Supple.  No carotid bruits noted.  RESPIRATORY:  Clear to auscultation and percussion.  CARDIOVASCULAR:  A regular rate and rhythm without obvious murmurs or rubs.  EXTREMITIES:  Without significant edema.  NEUROLOGIC:  Cranial nerves as above.  Facial symmetry is present.  The patient has good sensation on the face to pinprick and soft touch bilaterally. The patient has good strength of the facial muscles and the muscles of head turning and shoulder shrug bilaterally.  The patient has full visual fields to double simultaneous stimulation.  Speech is well-enunciated.  Motor testing reveals 5/5 strength in all fours with good, symmetric motor tone  noted throughout.  Sensory testing is intact to pinprick, soft touch, vibratory sensation throughout.  Cerebellar testing reveals good finger-nose-finger, toe-to-finger bilaterally.  The patient was not ambulated.  Rapid alternating movements of the upper extremities were symmetric and normal.  Deep tendon reflexes were symmetric and normal.  Toes were downgoing bilaterally.  LABORATORY DATA:  Notable for creatinine of 1.2, glucose was 95, BUN of 15, sodium of 140, potassium 4.1, chloride of 107.  Hemoglobin 14, hematocrit 42. EKG reveals normal sinus rhythm, septal  infarct age undetermined, heart rate of 62.  IMPRESSION:  Episodic left hemisensory deficits, rule out transient ischemic attack event.  This patient has a normal neurologic examination at this point.  The patient has truly transient events that have occurred on multiple occasions throughout the last two days.  Do need to consider and rule out the possibility of unstable neurologic condition with a large-vessel high-grade stenosis resulting in low flow phenomenon and multiple transient ischemic attack events referable to the right parietal area.  Will need to pursue further workup at this point.  PLAN: 1. The patient will need a CT scan of the head tonight. 2. IV heparin if the CT is negative. 3. MRI of the brain. 4. MR angiogram of the intracranial vessels. 5. Carotid Doppler study.  Will follow the patients clinical course while in-house.  The patient was on aspirin prior to this admission.     event. DD:  10/07/99 TD:  10/08/99 Job: [id number]

## 2010-08-13 ENCOUNTER — Encounter: Payer: Self-pay | Admitting: Nurse Practitioner

## 2010-08-21 ENCOUNTER — Ambulatory Visit: Payer: 59 | Admitting: Nurse Practitioner

## 2010-10-07 LAB — POCT I-STAT, CHEM 8
Calcium, Ion: 1.19
Glucose, Bld: 106 — ABNORMAL HIGH
HCT: 42
Hemoglobin: 14.3
Potassium: 3.8
TCO2: 28

## 2010-10-07 LAB — CBC
HCT: 43.3
MCHC: 34.4
MCV: 97.8
RBC: 4.42

## 2010-10-07 LAB — POCT CARDIAC MARKERS
Troponin i, poc: <0.05
Troponin i, poc: <0.05

## 2010-10-07 LAB — DIFFERENTIAL
Basophils Relative: 1
Eosinophils Absolute: 0.1
Eosinophils Relative: 2
Monocytes Relative: 10
Neutrophils Relative %: 48

## 2010-10-25 ENCOUNTER — Telehealth: Payer: Self-pay | Admitting: Nurse Practitioner

## 2010-10-25 NOTE — Telephone Encounter (Signed)
error 

## 2010-10-28 ENCOUNTER — Ambulatory Visit (INDEPENDENT_AMBULATORY_CARE_PROVIDER_SITE_OTHER): Payer: 59 | Admitting: Nurse Practitioner

## 2010-10-28 ENCOUNTER — Encounter: Payer: Self-pay | Admitting: Nurse Practitioner

## 2010-10-28 VITALS — BP 132/88 | HR 68 | Ht 66.0 in | Wt 176.0 lb

## 2010-10-28 DIAGNOSIS — I350 Nonrheumatic aortic (valve) stenosis: Secondary | ICD-10-CM | POA: Insufficient documentation

## 2010-10-28 DIAGNOSIS — R5383 Other fatigue: Secondary | ICD-10-CM | POA: Insufficient documentation

## 2010-10-28 DIAGNOSIS — I2510 Atherosclerotic heart disease of native coronary artery without angina pectoris: Secondary | ICD-10-CM

## 2010-10-28 DIAGNOSIS — E785 Hyperlipidemia, unspecified: Secondary | ICD-10-CM

## 2010-10-28 DIAGNOSIS — R5381 Other malaise: Secondary | ICD-10-CM

## 2010-10-28 DIAGNOSIS — I359 Nonrheumatic aortic valve disorder, unspecified: Secondary | ICD-10-CM

## 2010-10-28 NOTE — Progress Notes (Signed)
    Jose Petty Date of Birth: 1940/06/30 Medical Record #[id number]  History of Present Illness: Jose Petty is seen back today for his 6 month visit. He is a former patient of Dr. Ronnald Nian. He has been reassigned to Dr. Elease Hashimoto. Overall, he has been feeling ok. He has a degree of chronic fatigue that is unchanged. No chest pain or shortness of breath. No syncope. He does not exercise regularly but tries to stay active. His last echo was in August of 2011 and we will need to update. He is tolerating his medicines. He does need labs but is not fasting today.   Current Outpatient Prescriptions on File Prior to Visit  Medication Sig Dispense Refill  . aspirin 81 MG tablet Take 81 mg by mouth daily.        Marland Kitchen esomeprazole (NEXIUM) 40 MG capsule Take 40 mg by mouth daily before breakfast.        . naproxen sodium (ANAPROX) 220 MG tablet Take 220 mg by mouth as needed.        . rosuvastatin (CRESTOR) 5 MG tablet Take a half of a tablet on Monday/wednesday/friday po  60 tablet  3    No Known Allergies  Past Medical History  Diagnosis Date  . Aortic stenosis     Moderate per echo in August of 2011.   Marland Kitchen LBBB (left bundle branch block)     Has dyssynchrony but not felt to be significant enough to warrant resynchronization therapy per [facility].   . Coronary artery disease     Minimal per cath in 2010  . S/P AV nodal ablation 2008 AND 2009  . Fatigue     Chronic  . SVT (supraventricular tachycardia)   . PVC's (premature ventricular contractions)   . GERD (gastroesophageal reflux disease)   . Hyperlipidemia     Past Surgical History  Procedure Date  . Cardiac catheterization 01/27/2008    EF 45%; 30% prox LAD, 20 to 30% RCA  . Cardiac catheterization 07/28/2003  . US echocardiography 08/20/2009    EF 55-60%; Moderate LVH, septal bounce consistent with LBBB, calcified aortic valve with moderate AS/AI    History  Smoking status  . Former Smoker  . Quit date: 01/07/1984  Smokeless tobacco  .  Not on file    History  Alcohol Use No    Family History  Problem Relation Age of Onset  . Heart attack Father   . Heart disease Brother     Review of Systems: The review of systems is positive for chronic fatigue.  All other systems were reviewed and are negative.  Physical Exam: BP 132/88  Pulse 68  Ht 5\' 6"  (1.676 m)  Wt 176 lb (79.833 kg)  BMI 28.41 kg/m2 Patient is very pleasant and in no acute distress. Skin is warm and dry. Color is normal.  HEENT is unremarkable. Normocephalic/atraumatic. PERRL. Sclera are nonicteric. Neck is supple. No masses. No JVD. Lungs are clear. Cardiac exam shows a regular rate and rhythm. He has a grade 2/6 murmur of AS. fAbdomen is soft. Extremities are without edema. Gait and ROM are intact. No gross neurologic deficits noted.   LABORATORY DATA:   Assessment / Plan:

## 2010-10-28 NOTE — Assessment & Plan Note (Signed)
No chest pain. Last cath in 2010 showing just mild CAD. Continue with risk factor modification.

## 2010-10-28 NOTE — Patient Instructions (Signed)
We are going to update your echocardiogram of your heart.  We will check your labs the day you come for the echo (come fasting)  We will see you back in 3 months.  You will see Dr. Delane Ginger on return.   Call for any problems.

## 2010-10-28 NOTE — Assessment & Plan Note (Signed)
We will check fasting labs at his echo appointment.

## 2010-10-28 NOTE — Assessment & Plan Note (Signed)
This is a chronic issue and seems to be unchanged.

## 2010-10-28 NOTE — Assessment & Plan Note (Signed)
He is not having any cardinal symptoms. We will update the echo. I will have him see Dr. Elease Hashimoto in about 3 months to establish care. He is reminded of the symptoms to be on the lookout for.  Patient is agreeable to this plan and will call if any problems develop in the interim.

## 2010-11-20 ENCOUNTER — Ambulatory Visit (HOSPITAL_COMMUNITY): Payer: 59 | Attending: Cardiology

## 2010-11-20 ENCOUNTER — Other Ambulatory Visit (INDEPENDENT_AMBULATORY_CARE_PROVIDER_SITE_OTHER): Payer: 59 | Admitting: *Deleted

## 2010-11-20 DIAGNOSIS — I079 Rheumatic tricuspid valve disease, unspecified: Secondary | ICD-10-CM | POA: Insufficient documentation

## 2010-11-20 DIAGNOSIS — I359 Nonrheumatic aortic valve disorder, unspecified: Secondary | ICD-10-CM

## 2010-11-20 DIAGNOSIS — E785 Hyperlipidemia, unspecified: Secondary | ICD-10-CM | POA: Insufficient documentation

## 2010-11-20 DIAGNOSIS — I080 Rheumatic disorders of both mitral and aortic valves: Secondary | ICD-10-CM | POA: Insufficient documentation

## 2010-11-20 DIAGNOSIS — I350 Nonrheumatic aortic (valve) stenosis: Secondary | ICD-10-CM

## 2010-11-20 DIAGNOSIS — I319 Disease of pericardium, unspecified: Secondary | ICD-10-CM | POA: Insufficient documentation

## 2010-11-20 LAB — BASIC METABOLIC PANEL
BUN: 18 mg/dL (ref 6–23)
CO2: 26 mEq/L (ref 19–32)
Calcium: 8.7 mg/dL (ref 8.4–10.5)
Chloride: 107 mEq/L (ref 96–112)
Creatinine, Ser: 0.8 mg/dL (ref 0.4–1.5)
GFR: 98.87 mL/min (ref >60.00)
Glucose, Bld: 90 mg/dL (ref 70–99)
Potassium: 4.3 mEq/L (ref 3.5–5.1)
Sodium: 143 mEq/L (ref 135–145)

## 2010-11-20 LAB — LIPID PANEL
Cholesterol: 152 mg/dL (ref 0–200)
HDL: 44.9 mg/dL (ref >39.00)
LDL Cholesterol: 81 mg/dL (ref 0–99)
Total CHOL/HDL Ratio: 3
Triglycerides: 129 mg/dL (ref 0.0–149.0)
VLDL: 25.8 mg/dL (ref 0.0–40.0)

## 2010-11-20 LAB — HEPATIC FUNCTION PANEL
ALT: 20 U/L (ref 0–53)
AST: 21 U/L (ref 0–37)
Albumin: 3.8 g/dL (ref 3.5–5.2)
Alkaline Phosphatase: 62 U/L (ref 39–117)
Bilirubin, Direct: 0 mg/dL (ref 0.0–0.3)
Total Bilirubin: 0.6 mg/dL (ref 0.3–1.2)
Total Protein: 6.3 g/dL (ref 6.0–8.3)

## 2011-02-18 ENCOUNTER — Ambulatory Visit: Payer: 59 | Admitting: Cardiovascular Disease

## 2011-02-24 ENCOUNTER — Ambulatory Visit (INDEPENDENT_AMBULATORY_CARE_PROVIDER_SITE_OTHER): Payer: 59 | Admitting: Cardiovascular Disease

## 2011-02-24 ENCOUNTER — Encounter: Payer: Self-pay | Admitting: Cardiovascular Disease

## 2011-02-24 DIAGNOSIS — I359 Nonrheumatic aortic valve disorder, unspecified: Secondary | ICD-10-CM

## 2011-02-24 DIAGNOSIS — I350 Nonrheumatic aortic (valve) stenosis: Secondary | ICD-10-CM

## 2011-02-24 DIAGNOSIS — E785 Hyperlipidemia, unspecified: Secondary | ICD-10-CM

## 2011-02-24 DIAGNOSIS — I10 Essential (primary) hypertension: Secondary | ICD-10-CM

## 2011-02-24 DIAGNOSIS — I509 Heart failure, unspecified: Secondary | ICD-10-CM

## 2011-02-24 NOTE — Assessment & Plan Note (Signed)
His BP is elevated and likely increases during exercise.  This may be contributing to his exercise induced fatigue.  He would like to not start any additional meds at this point.  He does not eat lots of extra salt.  He is to resume a regular exercise regimin.

## 2011-02-24 NOTE — Assessment & Plan Note (Signed)
His AS is only mild.  He also has aortic insufficiency.

## 2011-02-24 NOTE — Progress Notes (Signed)
Jose Petty Date of Birth  12/22/1940 [facility]     [place] Office  [address]    [address]   [address] [place], [place]  [address]    [place], [place]  [address] [phone]  Fax  [phone]  [phone]  Fax [phone]  Problem List: 1. Aortic sclerosis / aortic stenosis 2.  SVT - s/p AV nodal ablation, 2008 , 2009 3. Hyperlipidemia 4.  LBBB 5. Mild CAD  History of Present Illness:  Jose Petty is a 71 yo with the above noted hx.  He has had lots of fatigue and muscle aches ( possibly due to crestor).  An echo in Nov. 20-12 revealed an EF of 45% and some dyssynchrony.  He has significant fatigue with exertion which is new over the past several years.    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  Current Outpatient Prescriptions on File Prior to Visit  Medication Sig Dispense Refill  . aspirin 81 MG tablet Take 81 mg by mouth daily.        Marland Kitchen esomeprazole (NEXIUM) 40 MG capsule Take 40 mg by mouth daily before breakfast.        . rosuvastatin (CRESTOR) 5 MG tablet Take a half of a tablet on Monday/wednesday/friday po  60 tablet  3    No Known Allergies  Past Medical History  Diagnosis Date  . Aortic stenosis     Moderate per echo in August of 2011.   Marland Kitchen LBBB (left bundle branch block)     Has dyssynchrony but not felt to be significant enough to warrant resynchronization therapy per [facility].   . Coronary artery disease     Minimal per cath in 2010  . S/P AV nodal ablation 2008 AND 2009  . Fatigue     Chronic  . SVT (supraventricular tachycardia)   . PVC's (premature ventricular contractions)   . GERD (gastroesophageal reflux disease)   . Hyperlipidemia     Past Surgical History  Procedure Date  . Cardiac catheterization 01/27/2008    EF 45%; 30% prox LAD, 20 to 30% RCA  . Cardiac catheterization 07/28/2003  . US echocardiography 08/20/2009    EF 55-60%; Moderate LVH, septal bounce consistent with  LBBB, calcified aortic valve with moderate AS/AI    History  Smoking status  . Former Smoker  . Quit date: 01/07/1984  Smokeless tobacco  . Not on file    History  Alcohol Use No    Family History  Problem Relation Age of Onset  . Heart attack Father   . Heart disease Brother     Reviw of Systems:  Reviewed in the HPI.  All other systems are negative.  Physical Exam: Blood pressure 151/83, pulse 67, height 5\' 7"  (1.702 m), weight 177 lb 6.4 oz (80.468 kg). General: Well developed, well nourished, in no acute distress.  Head: Normocephalic, atraumatic, sclera non-icteric, mucus membranes are moist,   Neck: Supple. Negative for carotid bruits. JVD not elevated.  Lungs: Clear bilaterally to auscultation without wheezes, rales, or rhonchi. Breathing is unlabored.  Heart: RRR with S1 S2. No murmurs, rubs, or gallops appreciated.  Abdomen: Soft, non-tender, non-distended with normoactive bowel sounds. No hepatomegaly. No rebound/guarding. No obvious abdominal masses.  Msk:  Strength and tone appear normal for age.  Extremities: No clubbing or cyanosis. No edema.  Distal pedal pulses are 2+ and equal bilaterally.  Neuro: Alert and oriented  X 3. Moves all extremities spontaneously.  Psych:  Responds to questions appropriately with a normal affect.  ECG:  Assessment / Plan:

## 2011-02-24 NOTE — Assessment & Plan Note (Addendum)
He is having lots of muscle aches.  He was on lipitor previously .  He will try co-enzyme Q-10 and will also try holding his Crestor for several weeks to see if that helps.

## 2011-02-24 NOTE — Assessment & Plan Note (Signed)
He has an EF of 40-45% and LBBB with dyssynchrony.   We will need to watch this carefully.  He may need a bi-v pacer

## 2011-02-24 NOTE — Patient Instructions (Addendum)
Try Co-enzyme Q-10 for your muscle aches.  Your physician recommends that you schedule a follow-up appointment in: 3 months  Your physician has recommended you make the following change in your medication: Hold your Crestor for 2 weeks to see if your muscle aches resolve.  Please call Jodette, Dr Harvie Bridge nurse at [phone] and let her know if they are better.

## 2011-03-12 ENCOUNTER — Telehealth: Payer: Self-pay | Admitting: Cardiovascular Disease

## 2011-03-12 NOTE — Telephone Encounter (Signed)
Pt signed ROI to have all Records sent from [facility] to Dr.Nahser, These Records were Received 03/12/11 and Given to [facility] for pt Appt 05/27/11 @ 1:45 03/12/11/KM

## 2011-03-31 ENCOUNTER — Encounter: Payer: Self-pay | Admitting: Gastroenterology

## 2011-05-27 ENCOUNTER — Ambulatory Visit (INDEPENDENT_AMBULATORY_CARE_PROVIDER_SITE_OTHER): Payer: Medicare Other | Admitting: Cardiovascular Disease

## 2011-05-27 ENCOUNTER — Encounter: Payer: Self-pay | Admitting: Cardiovascular Disease

## 2011-05-27 VITALS — BP 136/80 | HR 70 | Ht 66.0 in | Wt 154.0 lb

## 2011-05-27 DIAGNOSIS — I350 Nonrheumatic aortic (valve) stenosis: Secondary | ICD-10-CM

## 2011-05-27 DIAGNOSIS — E785 Hyperlipidemia, unspecified: Secondary | ICD-10-CM

## 2011-05-27 DIAGNOSIS — I359 Nonrheumatic aortic valve disorder, unspecified: Secondary | ICD-10-CM

## 2011-05-27 DIAGNOSIS — I2510 Atherosclerotic heart disease of native coronary artery without angina pectoris: Secondary | ICD-10-CM

## 2011-05-27 MED ORDER — ROSUVASTATIN CALCIUM 5 MG PO TABS: ORAL_TABLET | ORAL | Status: DC

## 2011-05-27 NOTE — Progress Notes (Signed)
Jose Petty Date of Birth  [birth year] [facility]     [place] Office  [address]    [address]   [address] [place], [place]  [address]    [place], [place]  [address] [phone]  Fax  [phone]  [phone]  Fax [phone]  Problem List: 1. Aortic sclerosis / aortic stenosis 2.  SVT - s/p AV nodal ablation, 2008 , 2009 3. Hyperlipidemia 4.  LBBB 5. Mild CAD  History of Present Illness:  Jose Petty is a 71 yo with the above noted hx.  He has had lots of fatigue and muscle aches ( possibly due to crestor).  An echo in Nov. 20-12 revealed an EF of 45% and some dyssynchrony.  He has significant fatigue with exertion which is new over the past several years.    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  He stopped the Crestor and his muscle aches have improved.  He has been taking Co-Q 10.  He is willing to take a low dose statin. 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   He needs to get his colonscopy.   Current Outpatient Prescriptions on File Prior to Visit  Medication Sig Dispense Refill  . acetaminophen (TYLENOL) 500 MG tablet Take 500 mg by mouth every 6 (six) hours as needed.      Marland Kitchen aspirin 81 MG tablet Take 81 mg by mouth daily.        . calcipotriene-betamethasone (TACLONEX) ointment Apply 1 application topically as needed.       Marland Kitchen esomeprazole (NEXIUM) 40 MG capsule Take 40 mg by mouth daily before breakfast.        . latanoprost (XALATAN) 0.005 % ophthalmic solution 1 drop at bedtime.      Marland Kitchen VIAGRA 100 MG tablet Take 100 mg by mouth as needed.         No Known Allergies  Past Medical History  Diagnosis Date  . Aortic stenosis     Moderate per echo in August of 2011.   Marland Kitchen LBBB (left bundle branch block)     Has dyssynchrony but not felt to be significant enough to warrant resynchronization therapy per [facility].   . Coronary artery disease     Minimal per cath in 2010   . S/P AV nodal ablation 2008 AND 2009  . Fatigue     Chronic  . SVT (supraventricular tachycardia)   . PVC's (premature ventricular contractions)   . GERD (gastroesophageal reflux disease)   . Hyperlipidemia     Past Surgical History  Procedure Date  . Cardiac catheterization 01/27/2008    EF 45%; 30% prox LAD, 20 to 30% RCA  . Cardiac catheterization 07/28/2003  . US echocardiography 08/20/2009    EF 55-60%; Moderate LVH, septal bounce consistent with LBBB, calcified aortic valve with moderate AS/AI    History  Smoking status  . Former Smoker  . Quit date: 01/07/1984  Smokeless tobacco  . Not on file    History  Alcohol Use No    Family History  Problem Relation Age of Onset  . Heart attack Father   . Heart disease Brother     Reviw of Systems:  Reviewed in the HPI.  All other systems are negative.  Physical Exam: Blood pressure 136/80, pulse 70, height 5\' 6"  (1.676 m), weight 154 lb (69.854 kg). General: Well developed, well nourished,  in no acute distress.  Head: Normocephalic, atraumatic, sclera non-icteric, mucus membranes are moist,   Neck: Supple. Negative for carotid bruits. JVD not elevated.  Lungs: Clear bilaterally to auscultation without wheezes, rales, or rhonchi. Breathing is unlabored.  Heart: RRR with S1 S2. There is a soft systolic murmur  Abdomen: Soft, non-tender, non-distended with normoactive bowel sounds. No hepatomegaly. No rebound/guarding. No obvious abdominal masses.  Msk:  Strength and tone appear normal for age.  Extremities: No clubbing or cyanosis. No edema.  Distal pedal pulses are 2+ and equal bilaterally.  Neuro: Alert and oriented X 3. Moves all extremities spontaneously.  Psych:  Responds to questions appropriately with a normal affect.  ECG: May 27, 2011 - NSR at 71. LBBB Assessment / Plan:

## 2011-05-27 NOTE — Assessment & Plan Note (Signed)
His AS is only mild.  He also has aortic insufficiency.

## 2011-05-27 NOTE — Assessment & Plan Note (Signed)
He's had an intolerance to Crestor and atorvastatin when taken on a daily dose. He is willing to try a lower dose. We'll try him on Crestor 5 mg per week. We'll check his labs again in 3 months. I'll see him in 6 months for followup visit.

## 2011-05-27 NOTE — Patient Instructions (Signed)
Your physician wants you to follow-up in: 6 months  You will receive a reminder letter in the mail two months in advance. If you don't receive a letter, please call our office to schedule the follow-up appointment.   Your physician recommends that you return for a FASTING lipid profile: 3 months   Your physician has recommended you make the following change in your medication:   Try to take crestor 5 mg a week.

## 2011-07-15 ENCOUNTER — Encounter: Payer: Self-pay | Admitting: Gastroenterology

## 2011-08-13 ENCOUNTER — Encounter: Payer: Self-pay | Admitting: Gastroenterology

## 2011-08-13 ENCOUNTER — Ambulatory Visit (INDEPENDENT_AMBULATORY_CARE_PROVIDER_SITE_OTHER): Payer: Medicare Other | Admitting: Gastroenterology

## 2011-08-13 VITALS — BP 136/78 | HR 68 | Ht 66.0 in | Wt 171.0 lb

## 2011-08-13 DIAGNOSIS — K625 Hemorrhage of anus and rectum: Secondary | ICD-10-CM

## 2011-08-13 DIAGNOSIS — K921 Melena: Secondary | ICD-10-CM

## 2011-08-13 MED ORDER — MOVIPREP 100 G PO SOLR: ORAL | Status: DC

## 2011-08-13 NOTE — Progress Notes (Signed)
History of Present Illness:  This is a somewhat complex 71 year old Caucasian male with multiple cardiovascular issues including previous atrial fibrillation requiring ablation therapy, mild coronary artery disease, and stable aortic stenosis. He is followed closely by primary care and cardiology. He had screening colonoscopy in January 2006 which was unremarkable. The patient had been doing well without gastrointestinal issues until June when he was placed on meloxicam because of the neck disc problem. He became severely constipated and had an episode of straining with bright red blood per rectum which lasted one bowel movement. He had no abdominal or rectal pain, but was seen by primary care and empirically placed on metronidazole and ciprofloxacin for suspected diverticulitis. Patient currently is asymptomatic and is having regular bowel movements without abdominal rectal pain. His appetite is good and his weight is stable. He denies upper gastrointestinal or hepatobiliary complaints. He is on aspirin 81 mg a day, but denies other anticoagulant use.  I have reviewed this patient's present history, medical and surgical past history, allergies and medications.     ROS: The remainder of the 10 point ROS is negative... he denies chest pain with exertion, symptoms of congestive heart failure, palpitations, or other cardiopulmonary symptoms. Family history is noncontributory.     Physical Exam: Blood pressure 136/78, pulse 68 and regular, and weight 171 pounds with BMI of 27.60 General well developed well nourished patient in no acute distress, appearing his stated age Eyes PERRLA, no icterus, fundoscopic exam per opthamologist Skin no lesions noted Neck supple, no adenopathy, no thyroid enlargement, no tenderness Chest clear to percussion and auscultation Heart no significant murmurs, gallops or rubs noted Abdomen no hepatosplenomegaly masses or tenderness, BS normal.  Rectal inspection normal no  fissures, or fistulae noted.  No masses or tenderness on digital exam. Stool guaiac negative. Extremities no acute joint lesions, edema, phlebitis or evidence of cellulitis. Neurologic patient oriented x 3, cranial nerves intact, no focal neurologic deficits noted. Psychological mental status normal and normal affect.  Assessment and plan: Probable recent rectal bleeding from internal hemorrhoid associated with NSAID use and constipation. I doubt that he had diverticulitis or diverticular hemorrhage. He is due for followup colonoscopy which we have scheduled at his convenience with preparation with a balanced electrolyte solution, cardiac monitor sedation, and propofol administration by the nurse anesthesia. The patient's wife is with him today throughout the interview and exam. I reviewed all of his reports and clinical course and medications with them in detail. Also discussed alternative methods of colon examination including CT colonoscopy and barium enema. The patient was concerned about possible complications and colonoscopy, and I discussed in details the risk of post-polypectomy bleeding should he have endoscopic polypectomy. Also discussed was a small but possible risk of colonic perforation, drug reaction, arrhythmias et Karie Soda. In my opinion he is in good health at this time, and should be able to tolerate colonoscopy and possible polypectomy without difficulty. Patient does take daily Nexium for acid reflux, and had endoscopy in 2006 without evidence of Barrett's mucosa.  Encounter Diagnosis  Name Primary?  . Blood in stool Yes

## 2011-08-13 NOTE — Patient Instructions (Addendum)
You have been scheduled for a colonoscopy with propofol. Please follow written instructions given to you at your visit today.  Please pick up your prep kit at the pharmacy within the next 1-3 days. If you use inhalers (even only as needed), please bring them with you on the day of your procedure. CC:Dr. Tracey Harries

## 2011-08-21 ENCOUNTER — Other Ambulatory Visit (INDEPENDENT_AMBULATORY_CARE_PROVIDER_SITE_OTHER): Payer: Medicare Other

## 2011-08-21 DIAGNOSIS — E785 Hyperlipidemia, unspecified: Secondary | ICD-10-CM

## 2011-08-21 DIAGNOSIS — I2510 Atherosclerotic heart disease of native coronary artery without angina pectoris: Secondary | ICD-10-CM

## 2011-08-21 LAB — HEPATIC FUNCTION PANEL
ALT: 19 U/L (ref 0–53)
AST: 22 U/L (ref 0–37)
Albumin: 4 g/dL (ref 3.5–5.2)
Total Bilirubin: 1.3 mg/dL — ABNORMAL HIGH (ref 0.3–1.2)

## 2011-08-21 LAB — LIPID PANEL
HDL: 45 mg/dL (ref >39.00)
Total CHOL/HDL Ratio: 4
Triglycerides: 166 mg/dL — ABNORMAL HIGH (ref 0.0–149.0)
VLDL: 33.2 mg/dL (ref 0.0–40.0)

## 2011-08-21 LAB — BASIC METABOLIC PANEL
CO2: 27 mEq/L (ref 19–32)
Calcium: 9 mg/dL (ref 8.4–10.5)
GFR: 98.65 mL/min (ref >60.00)
Glucose, Bld: 102 mg/dL — ABNORMAL HIGH (ref 70–99)
Potassium: 4.1 mEq/L (ref 3.5–5.1)
Sodium: 139 mEq/L (ref 135–145)

## 2011-08-27 ENCOUNTER — Other Ambulatory Visit: Payer: Medicare Other

## 2011-09-10 ENCOUNTER — Encounter: Payer: Medicare Other | Admitting: Gastroenterology

## 2011-09-11 ENCOUNTER — Telehealth: Payer: Self-pay | Admitting: Gastroenterology

## 2011-09-11 NOTE — Telephone Encounter (Signed)
Dentist started patient on Amoxicillin for a tooth abscess and he wanted to make sure if ok to continue prescription since colonoscopy is next Monday. Advised patient he is ok to continue this Rx.

## 2011-09-12 ENCOUNTER — Telehealth: Payer: Self-pay | Admitting: Cardiovascular Disease

## 2011-09-12 NOTE — Telephone Encounter (Signed)
Pt rtning call to get lab results

## 2011-09-12 NOTE — Telephone Encounter (Signed)
Left message for pt to call.

## 2011-09-15 ENCOUNTER — Ambulatory Visit (AMBULATORY_SURGERY_CENTER): Payer: Medicare Other | Admitting: Gastroenterology

## 2011-09-15 ENCOUNTER — Encounter: Payer: Self-pay | Admitting: Gastroenterology

## 2011-09-15 VITALS — BP 141/64 | HR 66 | Temp 97.9°F | Resp 18 | Ht 66.0 in | Wt 171.0 lb

## 2011-09-15 DIAGNOSIS — Z1211 Encounter for screening for malignant neoplasm of colon: Secondary | ICD-10-CM

## 2011-09-15 DIAGNOSIS — K5730 Diverticulosis of large intestine without perforation or abscess without bleeding: Secondary | ICD-10-CM

## 2011-09-15 DIAGNOSIS — K644 Residual hemorrhoidal skin tags: Secondary | ICD-10-CM

## 2011-09-15 DIAGNOSIS — K921 Melena: Secondary | ICD-10-CM

## 2011-09-15 MED ORDER — SODIUM CHLORIDE 0.9 % IV SOLN: 500.0000 mL | INTRAVENOUS | Status: DC

## 2011-09-15 NOTE — Op Note (Signed)
[facility] [address] [place], [address]   COLONOSCOPY PROCEDURE REPORT  PATIENT: Jose, Petty  MR#: [id number] BIRTHDATE: 1940/10/02 , 71  yrs. old GENDER: Male ENDOSCOPIST: Mardella Layman, MD, [facility] REFERRED BY: PROCEDURE DATE:  09/15/2011 PROCEDURE:   Colonoscopy, screening ASA CLASS:   Class III INDICATIONS:average risk patient for colon cancer and anal bleeding.  MEDICATIONS: propofol (Diprivan) 150mg  IV  DESCRIPTION OF PROCEDURE:   After the risks and benefits and of the procedure were explained, informed consent was obtained.  A digital rectal exam revealed external hemorrhoids.    The LB [id number]  endoscope was introduced through the anus and advanced to the cecum, which was identified by both the appendix and ileocecal valve .  The quality of the prep was excellent, using MoviPrep . The instrument was then slowly withdrawn as the colon was fully examined.     COLON FINDINGS: Mild diverticulosis was noted in the sigmoid colon. The colon was otherwise normal.  There was no diverticulosis, inflamation, polyps or cancers unless previously stated.   The colon mucosa was otherwise normal.   External hemorrhoids were found.     Retroflexed views revealed no abnormalities.     The scope was then withdrawn from the patient and the procedure completed.  COMPLICATIONS: There were no complications. ENDOSCOPIC IMPRESSION: 1.   Mild diverticulosis was noted in the sigmoid colon 2.   The colon was otherwise normal 3.   External hemorrhoids  RECOMMENDATIONS: 1.  continue current medications 2.  High fiber diet 3.  You should continue to follow colorectal cancer screening guidelines for "routine risk" patients with a repeat colonoscopy in 10 years.  There is no need for FOBT (stool) testing for at least 5 years.   REPEAT EXAM:  MV:HQION Everlene Other, MD  _______________________________ eSignedMardella Layman, MD, [facility] 09/15/2011 9:38  AM

## 2011-09-15 NOTE — Patient Instructions (Addendum)
YOU HAD AN ENDOSCOPIC PROCEDURE TODAY AT THE [facility]: Refer to the procedure report that was given to you for any specific questions about what was found during the examination.  If the procedure report does not answer your questions, please call your gastroenterologist to clarify.  If you requested that your care partner not be given the details of your procedure findings, then the procedure report has been included in a sealed envelope for you to review at your convenience later.  YOU SHOULD EXPECT: Some feelings of bloating in the abdomen. Passage of more gas than usual.  Walking can help get rid of the air that was put into your GI tract during the procedure and reduce the bloating. If you had a lower endoscopy (such as a colonoscopy or flexible sigmoidoscopy) you may notice spotting of blood in your stool or on the toilet paper. If you underwent a bowel prep for your procedure, then you may not have a normal bowel movement for a few days.  DIET: Your first meal following the procedure should be a light meal and then it is ok to progress to your normal diet.  A half-sandwich or bowl of soup is an example of a good first meal.  Heavy or fried foods are harder to digest and may make you feel nauseous or bloated.  Likewise meals heavy in dairy and vegetables can cause extra gas to form and this can also increase the bloating.  Drink plenty of fluids but you should avoid alcoholic beverages for 24 hours.  ACTIVITY: Your care partner should take you home directly after the procedure.  You should plan to take it easy, moving slowly for the rest of the day.  You can resume normal activity the day after the procedure however you should NOT DRIVE or use heavy machinery for 24 hours (because of the sedation medicines used during the test).    SYMPTOMS TO REPORT IMMEDIATELY: A gastroenterologist can be reached at any hour.  During normal business hours, 8:30 AM to 5:00 PM Monday through Friday,  call [phone].  After hours and on weekends, please call the GI answering service at [phone] who will take a message and have the physician on call contact you.   Following lower endoscopy (colonoscopy or flexible sigmoidoscopy):  Excessive amounts of blood in the stool  Significant tenderness or worsening of abdominal pains  Swelling of the abdomen that is new, acute  Fever of 100F or higher  FOLLOW UP: If any biopsies were taken you will be contacted by phone or by letter within the next 1-3 weeks.  Call your gastroenterologist if you have not heard about the biopsies in 3 weeks.  Our staff will call the home number listed on your records the next business day following your procedure to check on you and address any questions or concerns that you may have at that time regarding the information given to you following your procedure. This is a courtesy call and so if there is no answer at the home number and we have not heard from you through the emergency physician on call, we will assume that you have returned to your regular daily activities without incident.  SIGNATURES/CONFIDENTIALITY: You and/or your care partner have signed paperwork which will be entered into your electronic medical record.  These signatures attest to the fact that that the information above on your After Visit Summary has been reviewed and is understood.  Full responsibility of the confidentiality of this   discharge information lies with you and/or your care-partner.   Thank-you for choosing us for your healthcare needs. 

## 2011-09-15 NOTE — Progress Notes (Signed)
Patient did not have preoperative order for IV antibiotic SSI prophylaxis. (G8918)  Patient did not experience any of the following events: a burn prior to discharge; a fall within the facility; wrong site/side/patient/procedure/implant event; or a hospital transfer or hospital admission upon discharge from the facility. (G8907)  

## 2011-09-16 ENCOUNTER — Telehealth: Payer: Self-pay

## 2011-09-16 NOTE — Telephone Encounter (Signed)
  Follow up Call-  Call back number 09/15/2011  Post procedure Call Back phone  # [phone]  Permission to leave phone message Yes     Patient questions:  Do you have a fever, pain , or abdominal swelling? no Pain Score  0 *  Have you tolerated food without any problems? yes  Have you been able to return to your normal activities? yes  Do you have any questions about your discharge instructions: Diet   no Medications  no Follow up visit  no  Do you have questions or concerns about your Care? no  Actions: * If pain score is 4 or above: No action needed, pain <4.

## 2011-11-25 ENCOUNTER — Encounter: Payer: Self-pay | Admitting: Cardiovascular Disease

## 2011-11-25 ENCOUNTER — Ambulatory Visit (INDEPENDENT_AMBULATORY_CARE_PROVIDER_SITE_OTHER): Payer: Medicare Other | Admitting: Cardiovascular Disease

## 2011-11-25 VITALS — BP 160/90 | HR 74 | Ht 66.0 in | Wt 173.0 lb

## 2011-11-25 DIAGNOSIS — I447 Left bundle-branch block, unspecified: Secondary | ICD-10-CM

## 2011-11-25 DIAGNOSIS — I359 Nonrheumatic aortic valve disorder, unspecified: Secondary | ICD-10-CM

## 2011-11-25 DIAGNOSIS — I509 Heart failure, unspecified: Secondary | ICD-10-CM

## 2011-11-25 DIAGNOSIS — I350 Nonrheumatic aortic (valve) stenosis: Secondary | ICD-10-CM

## 2011-11-25 MED ORDER — CARVEDILOL 6.25 MG PO TABS: 6.2500 mg | ORAL_TABLET | Freq: Two times a day (BID) | ORAL | Status: DC

## 2011-11-25 NOTE — Assessment & Plan Note (Signed)
Jose Petty continues to have some issues with hypertension. He also describes some episodes of orthostatic hypotension. I also noticed that he has hand tremor and I wonder if he may have signs of early Parkinson's disease.  We will add carvedilol 6.25 mg twice a day. I'll see him in 3 months for followup visit. I would also like to add low-dose ACE inhibitor since his ejection fraction is mildly depressed and he has a left bundle branch block.

## 2011-11-25 NOTE — Patient Instructions (Addendum)
Niacin needs to be nicotinic acid ( not nicotinamide)   Your physician recommends that you schedule a follow-up appointment in: 3 months with ekg   Your physician has requested that you have an echocardiogram. Echocardiography is a painless test that uses sound waves to create images of your heart. It provides your doctor with information about the size and shape of your heart and how well your heart's chambers and valves are working. This procedure takes approximately one hour. There are no restrictions for this procedure.   Your physician has recommended you make the following change in your medication:  Start  Coreg 6.25 mg twice daily 12 hours apart

## 2011-11-25 NOTE — Assessment & Plan Note (Signed)
Jose Petty has mild congestive heart failure and a left bundle branch block. He has not been on any medical therapy.  He presents with lots of fatigue and symptoms of orthostatic hypotension.  I would  like to start him on carvedilol 6.25 mg twice a day. We discussed the fact that this would help with his mildly reduced ejection fraction.  If his ejection fraction falls below 35 then we will need to consider him for biventricular pacer.  We'll get a repeat echocardiogram later this week.  I would like to try to add a low-dose beta blocker and low-dose ACE inhibitor as tolerated. His orthostatic hypotension will limit that.

## 2011-11-25 NOTE — Progress Notes (Signed)
Jose Petty Date of Birth  10-Dec-1940 [facility]     [place] Office  [address]    [address]   [address] [place], [place]  [address]    [place], [place]  [address] [phone]  Fax  [phone]  [phone]  Fax [phone]  Problem List: 1. Aortic insufficiency / aortic stenosis 2.  SVT - s/p AV nodal ablation, 2008 , 2009 3. Hyperlipidemia 4.  LBBB 5. Mild CAD  History of Present Illness:  Jose Petty is a 71 yo with the above noted hx.  He has had lots of fatigue and muscle aches ( possibly due to crestor).  An echo in Nov. 20-12 revealed an EF of 45% and some dyssynchrony.  He has significant fatigue with exertion which is new over the past several years.    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  He stopped the Crestor and his muscle aches have improved.  He has been taking Co-Q 10.  He is willing to take a low dose statin. 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 He only tolerates low dose Crestor twice a week due to muscle aches.  He's not had any further episodes of SVT. His main complaint today is that is not feeling very well. He gets fatigued with little exertion.  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  He checks his BP regularly and his BP is typically OK.  He has noticed some balance issues - ( sounds like orthostatic hypotension).   Current Outpatient Prescriptions on File Prior to Visit  Medication Sig Dispense Refill  . acetaminophen (TYLENOL) 500 MG tablet Take 500 mg by mouth every 6 (six) hours as needed.      . AMOXICILLIN PO Take by mouth. BID for 10 days for dental work      . aspirin 81 MG tablet Take 81 mg by mouth daily.        . calcipotriene-betamethasone (TACLONEX) ointment Apply 1 application topically as needed.       . Coenzyme Q10 (CO Q 10 PO) Take by mouth 2 (two) times daily.       . diazepam (VALIUM) 5 MG tablet Take 5 mg by mouth as needed.       Marland Kitchen esomeprazole (NEXIUM) 40 MG capsule Take 40 mg by mouth daily before breakfast.        . latanoprost (XALATAN) 0.005 % ophthalmic solution 1 drop at bedtime.      . rosuvastatin (CRESTOR) 5 MG tablet Take one tablet/ 5mg  a week.  5 tablet  5  . VIAGRA 100 MG tablet Take 100 mg by mouth as needed.         Allergies  Allergen Reactions  . Atorvastatin     Muscle aches  . Crestor (Rosuvastatin)     Muscle aches    Past Medical History  Diagnosis Date  . Aortic stenosis     Moderate per echo in August of 2011.   Marland Kitchen LBBB (left bundle branch block)     Has dyssynchrony but not felt to be significant enough to warrant resynchronization therapy per [facility].   . Coronary artery disease     Minimal per cath in 2010  . S/P AV nodal ablation 2008 AND 2009  . Fatigue  Chronic  . SVT (supraventricular tachycardia)   . PVC's (premature ventricular contractions)   . GERD (gastroesophageal reflux disease)   . Hyperlipidemia   . Stricture and stenosis of esophagus 2006    EGD  . Hiatal hernia 2006    EGD   . Allergy   . Arthritis     thumbs  . Cancer     Past Surgical History  Procedure Date  . Cardiac catheterization 01/27/2008    EF 45%; 30% prox LAD, 20 to 30% RCA  . Cardiac catheterization 07/28/2003  . US echocardiography 08/20/2009    EF 55-60%; Moderate LVH, septal bounce consistent with LBBB, calcified aortic valve with moderate AS/AI  . Hernia repair   . Cataracts     both eyes  . Cardiac electrophysiology study and ablation     History  Smoking status  . Former Smoker  . Quit date: 01/07/1984  Smokeless tobacco  . Never Used    History  Alcohol Use No    Comment: limited    Family History  Problem Relation Age of Onset  . Heart attack Father   . Heart disease Brother   . Colon cancer Neg Hx   . Esophageal cancer Neg Hx   . Stomach cancer Neg Hx   . Rectal cancer Neg Hx      Reviw of Systems:  Reviewed in the HPI.  All other systems are negative.  Physical Exam: Blood pressure 160/90, pulse 74, height 5\' 6"  (1.676 m), weight 173 lb (78.472 kg). General: Well developed, well nourished, in no acute distress.  Head: Normocephalic, atraumatic, sclera non-icteric, mucus membranes are moist,   Neck: Supple. Negative for carotid bruits. JVD not elevated.  Lungs: Clear bilaterally to auscultation without wheezes, rales, or rhonchi. Breathing is unlabored.  Heart: RRR with S1 S2. There is a soft systolic murmur  Abdomen: Soft, non-tender, non-distended with normoactive bowel sounds. No hepatomegaly. No rebound/guarding. No obvious abdominal masses.  Msk:  Strength and tone appear normal for age.  Extremities: No clubbing or cyanosis. No edema.  Distal pedal pulses are 2+ and equal bilaterally.  I noted a hand tremor as I was checking his pulse.   Neuro: Alert and oriented X 3. Moves all extremities spontaneously.  Psych:  Responds to questions appropriately with a normal affect.  ECG: May 27, 2011 - NSR at 71. LBBB Assessment / Plan:

## 2011-12-02 ENCOUNTER — Ambulatory Visit (HOSPITAL_COMMUNITY): Payer: Medicare Other | Attending: Cardiology | Admitting: Radiology

## 2011-12-02 DIAGNOSIS — I350 Nonrheumatic aortic (valve) stenosis: Secondary | ICD-10-CM

## 2011-12-02 DIAGNOSIS — Z87891 Personal history of nicotine dependence: Secondary | ICD-10-CM | POA: Insufficient documentation

## 2011-12-02 DIAGNOSIS — I2510 Atherosclerotic heart disease of native coronary artery without angina pectoris: Secondary | ICD-10-CM | POA: Insufficient documentation

## 2011-12-02 DIAGNOSIS — I359 Nonrheumatic aortic valve disorder, unspecified: Secondary | ICD-10-CM | POA: Insufficient documentation

## 2011-12-02 DIAGNOSIS — E785 Hyperlipidemia, unspecified: Secondary | ICD-10-CM | POA: Insufficient documentation

## 2011-12-02 DIAGNOSIS — I517 Cardiomegaly: Secondary | ICD-10-CM | POA: Insufficient documentation

## 2011-12-02 DIAGNOSIS — I509 Heart failure, unspecified: Secondary | ICD-10-CM

## 2011-12-02 DIAGNOSIS — I447 Left bundle-branch block, unspecified: Secondary | ICD-10-CM

## 2011-12-02 DIAGNOSIS — R5381 Other malaise: Secondary | ICD-10-CM | POA: Insufficient documentation

## 2011-12-02 DIAGNOSIS — I10 Essential (primary) hypertension: Secondary | ICD-10-CM | POA: Insufficient documentation

## 2011-12-02 DIAGNOSIS — I498 Other specified cardiac arrhythmias: Secondary | ICD-10-CM | POA: Insufficient documentation

## 2011-12-02 DIAGNOSIS — R011 Cardiac murmur, unspecified: Secondary | ICD-10-CM | POA: Insufficient documentation

## 2011-12-02 NOTE — Progress Notes (Signed)
Echocardiogram performed.  

## 2011-12-09 ENCOUNTER — Telehealth: Payer: Self-pay | Admitting: *Deleted

## 2011-12-09 NOTE — Telephone Encounter (Signed)
When I called him with his echo results he c/o as he called it reflux symptoms. C/o esophageal pain/ tightness, belching for two days. Pt states he only takes nexium when needed and took it yesterday with some relief later in the day. Symptoms started today again soon after breakfast. Pt unable to get bp/p but will go to pharmacy and purchase a machine today, [place] is [place] home. Told pt to also take malanta or Maalox BID to help coat stomach, avoid laying down after eating, avoid caffeine and spicy fat laden foods. Told him to call with further questions or concerns.

## 2012-02-25 ENCOUNTER — Encounter: Payer: Self-pay | Admitting: Cardiovascular Disease

## 2012-02-25 ENCOUNTER — Other Ambulatory Visit: Payer: Self-pay

## 2012-02-25 ENCOUNTER — Ambulatory Visit (INDEPENDENT_AMBULATORY_CARE_PROVIDER_SITE_OTHER): Payer: Medicare Other | Admitting: Cardiovascular Disease

## 2012-02-25 VITALS — BP 148/78 | HR 80 | Ht 66.0 in | Wt 174.0 lb

## 2012-02-25 DIAGNOSIS — I350 Nonrheumatic aortic (valve) stenosis: Secondary | ICD-10-CM

## 2012-02-25 DIAGNOSIS — I359 Nonrheumatic aortic valve disorder, unspecified: Secondary | ICD-10-CM

## 2012-02-25 DIAGNOSIS — E785 Hyperlipidemia, unspecified: Secondary | ICD-10-CM

## 2012-02-25 DIAGNOSIS — I509 Heart failure, unspecified: Secondary | ICD-10-CM

## 2012-02-25 NOTE — Assessment & Plan Note (Signed)
He has not been able to tolerate Crestor because of muscle aches. His last lipid levels particularly his triglyceride levels were a little bit elevated. We'll have him come back in the next week or so for repeat fasting lipids, H&P, and basic metabolic profile.

## 2012-02-25 NOTE — Assessment & Plan Note (Addendum)
Jose Petty is doing well. His last ejection fraction was around 45%. We tried him on carvedilol 6.25 mg twice a day but he had lots of fatigue and orthostasis. He discontinued the Coreg. We'll restart him at 3.125 mg and see if he can tolerate that. I explained to him the benefits of beta blockers in the setting of congestive heart failure.

## 2012-02-25 NOTE — Assessment & Plan Note (Signed)
Jose Petty is doing well. He does not have any symptoms related to his aortic stenosis. His last echocardiogram was in 2013 which revealed a mean aortic valve gradient of 13.

## 2012-02-25 NOTE — Progress Notes (Signed)
Jose Petty Date of Birth  1940-06-15 [facility]     [place] Office  [address]    [address]   [address] [place], [place]  [address]    [place], [place]  [address] [phone]  Fax  [phone]  [phone]  Fax [phone]  Problem List: 1. Aortic insufficiency / aortic stenosis 2.  SVT - s/p AV nodal ablation, 2008 , 2009 3. Hyperlipidemia 4.  LBBB 5. Mild CAD  History of Present Illness:  Jose Petty is a 71 yo with the above noted hx.  He has had lots of fatigue and muscle aches ( possibly due to crestor).  An echo in Nov. 20-12 revealed an EF of 45% and some dyssynchrony.  He has significant fatigue with exertion which is new over the past several years.    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  He stopped the Crestor and his muscle aches have improved.  He has been taking Co-Q 10.  He is willing to take a low dose statin. 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 He only tolerates low dose Crestor twice a week due to muscle aches.  He's not had any further episodes of SVT. His main complaint today is that is not feeling very well. He gets fatigued with little exertion.  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  He checks his BP regularly and his BP is typically OK.  He has noticed some balance issues - (sounds like orthostatic hypotension).  Feb. 19, 2014:  Jose Petty is doing well.  He stopped his Coreg due to "not feeling well" , slight lightheadness.  He feels better off the coreg although he still has some dizziness even without the coreg.     Current Outpatient Prescriptions on File Prior to Visit  Medication Sig Dispense Refill  . acetaminophen (TYLENOL) 500 MG tablet Take 500 mg by mouth every 6 (six) hours as needed.      Marland Kitchen aspirin 81 MG tablet Take 81 mg by mouth daily.        .  calcipotriene-betamethasone (TACLONEX) ointment Apply 1 application topically as needed.       . Coenzyme Q10 (CO Q 10 PO) Take by mouth 2 (two) times daily.      . diazepam (VALIUM) 5 MG tablet Take 5 mg by mouth as needed.       Marland Kitchen esomeprazole (NEXIUM) 40 MG capsule Take 40 mg by mouth daily before breakfast.        . latanoprost (XALATAN) 0.005 % ophthalmic solution 1 drop at bedtime.      . rosuvastatin (CRESTOR) 5 MG tablet Take one tablet/ 5mg  a week.  5 tablet  5  . VIAGRA 100 MG tablet Take 100 mg by mouth as needed.       . carvedilol (COREG) 6.25 MG tablet Take 1 tablet (6.25 mg total) by mouth 2 (two) times daily.  60 tablet  5   No current facility-administered medications on file prior to visit.    Allergies  Allergen Reactions  . Atorvastatin     Muscle aches  . Crestor (Rosuvastatin)     Muscle aches    Past Medical History  Diagnosis Date  . Aortic stenosis     Moderate per echo in August of  2011.   Marland Kitchen LBBB (left bundle branch block)     Has dyssynchrony but not felt to be significant enough to warrant resynchronization therapy per [facility].   . Coronary artery disease     Minimal per cath in 2010  . S/P AV nodal ablation 2008 AND 2009  . Fatigue     Chronic  . SVT (supraventricular tachycardia)   . PVC's (premature ventricular contractions)   . GERD (gastroesophageal reflux disease)   . Hyperlipidemia   . Stricture and stenosis of esophagus 2006    EGD  . Hiatal hernia 2006    EGD   . Allergy   . Arthritis     thumbs  . Cancer     Past Surgical History  Procedure Laterality Date  . Cardiac catheterization  01/27/2008    EF 45%; 30% prox LAD, 20 to 30% RCA  . Cardiac catheterization  07/28/2003  . US echocardiography  08/20/2009    EF 55-60%; Moderate LVH, septal bounce consistent with LBBB, calcified aortic valve with moderate AS/AI  . Hernia repair    . Cataracts      both eyes  . Cardiac electrophysiology study and ablation      History    Smoking status  . Former Smoker  . Quit date: 01/07/1984  Smokeless tobacco  . Never Used    History  Alcohol Use No    Comment: limited    Family History  Problem Relation Age of Onset  . Heart attack Father   . Heart disease Brother   . Colon cancer Neg Hx   . Esophageal cancer Neg Hx   . Stomach cancer Neg Hx   . Rectal cancer Neg Hx     Reviw of Systems:  Reviewed in the HPI.  All other systems are negative.  Physical Exam: Blood pressure 150/80, pulse 80, height 5\' 6"  (1.676 m), weight 174 lb (78.926 kg), SpO2 97.00%. General: Well developed, well nourished, in no acute distress.  Head: Normocephalic, atraumatic, sclera non-icteric, mucus membranes are moist,   Neck: Supple. Negative for carotid bruits. JVD not elevated.  Lungs: Clear bilaterally to auscultation without wheezes, rales, or rhonchi. Breathing is unlabored.  Heart: RRR with S1 S2. There is a 2/6  systolic murmur  Abdomen: Soft, non-tender, non-distended with normoactive bowel sounds. No hepatomegaly. No rebound/guarding. No obvious abdominal masses.  Msk:  Strength and tone appear normal for age.  Extremities: No clubbing or cyanosis. No edema.  Distal pedal pulses are 2+ and equal bilaterally.  I noted a hand tremor as I was checking his pulse.   Neuro: Alert and oriented X 3. Moves all extremities spontaneously.  Psych:  Responds to questions appropriately with a normal affect.  ECG: May 27, 2011 - NSR at 71. LBBB Assessment / Plan:

## 2012-02-25 NOTE — Patient Instructions (Addendum)
Restart Coreg 3.125 mg twice a day.  Fasting Lab Work next week 03/04/12.   Your physician wants you to follow-up in: 6 months You will receive a reminder letter in the mail two months in advance. If you don't receive a letter, please call our office to schedule the follow-up appointment.

## 2012-02-26 ENCOUNTER — Other Ambulatory Visit (INDEPENDENT_AMBULATORY_CARE_PROVIDER_SITE_OTHER): Payer: Medicare Other

## 2012-02-26 DIAGNOSIS — I509 Heart failure, unspecified: Secondary | ICD-10-CM

## 2012-02-26 DIAGNOSIS — I359 Nonrheumatic aortic valve disorder, unspecified: Secondary | ICD-10-CM

## 2012-02-26 DIAGNOSIS — E785 Hyperlipidemia, unspecified: Secondary | ICD-10-CM

## 2012-02-26 DIAGNOSIS — I350 Nonrheumatic aortic (valve) stenosis: Secondary | ICD-10-CM

## 2012-02-26 LAB — HEPATIC FUNCTION PANEL
ALT: 21 U/L (ref 0–53)
AST: 18 U/L (ref 0–37)
Bilirubin, Direct: 0.1 mg/dL (ref 0.0–0.3)
Total Bilirubin: 1.2 mg/dL (ref 0.3–1.2)
Total Protein: 6.6 g/dL (ref 6.0–8.3)

## 2012-02-26 LAB — BASIC METABOLIC PANEL
BUN: 13 mg/dL (ref 6–23)
Calcium: 8.9 mg/dL (ref 8.4–10.5)
GFR: 90.79 mL/min (ref >60.00)
Glucose, Bld: 94 mg/dL (ref 70–99)
Potassium: 4.1 mEq/L (ref 3.5–5.1)
Sodium: 139 mEq/L (ref 135–145)

## 2012-02-26 LAB — LIPID PANEL: Cholesterol: 174 mg/dL (ref 0–200)

## 2012-03-03 ENCOUNTER — Ambulatory Visit
Admission: RE | Admit: 2012-03-03 | Discharge: 2012-03-03 | Disposition: A | Discharge status: Home / Self Care | Payer: Medicare Other | Source: Ambulatory Visit | Attending: Cardiovascular Disease | Admitting: Cardiovascular Disease

## 2012-03-03 DIAGNOSIS — I350 Nonrheumatic aortic (valve) stenosis: Secondary | ICD-10-CM

## 2012-03-03 MED ORDER — GADOBENATE DIMEGLUMINE 529 MG/ML IV SOLN
16.0000 mL | Freq: Once | INTRAVENOUS | Status: AC | PRN
  Administered 2012-03-03: 16 mL via INTRAVENOUS

## 2012-03-04 ENCOUNTER — Other Ambulatory Visit: Payer: Medicare Other

## 2012-03-19 ENCOUNTER — Emergency Department (HOSPITAL_COMMUNITY)
Admission: EM | Admit: 2012-03-19 | Discharge: 2012-03-19 | Disposition: A | Discharge status: Home / Self Care | Payer: Medicare Other | Attending: Emergency Medicine | Admitting: Emergency Medicine

## 2012-03-19 ENCOUNTER — Telehealth: Payer: Self-pay | Admitting: *Deleted

## 2012-03-19 ENCOUNTER — Encounter (HOSPITAL_COMMUNITY): Payer: Self-pay | Admitting: Neurology

## 2012-03-19 ENCOUNTER — Emergency Department (HOSPITAL_COMMUNITY): Payer: Medicare Other

## 2012-03-19 DIAGNOSIS — Z87891 Personal history of nicotine dependence: Secondary | ICD-10-CM | POA: Insufficient documentation

## 2012-03-19 DIAGNOSIS — Z8709 Personal history of other diseases of the respiratory system: Secondary | ICD-10-CM | POA: Insufficient documentation

## 2012-03-19 DIAGNOSIS — Z859 Personal history of malignant neoplasm, unspecified: Secondary | ICD-10-CM | POA: Insufficient documentation

## 2012-03-19 DIAGNOSIS — I4891 Unspecified atrial fibrillation: Secondary | ICD-10-CM

## 2012-03-19 DIAGNOSIS — I2510 Atherosclerotic heart disease of native coronary artery without angina pectoris: Secondary | ICD-10-CM | POA: Insufficient documentation

## 2012-03-19 DIAGNOSIS — Z79899 Other long term (current) drug therapy: Secondary | ICD-10-CM | POA: Insufficient documentation

## 2012-03-19 DIAGNOSIS — Z7982 Long term (current) use of aspirin: Secondary | ICD-10-CM | POA: Insufficient documentation

## 2012-03-19 DIAGNOSIS — Z8719 Personal history of other diseases of the digestive system: Secondary | ICD-10-CM | POA: Insufficient documentation

## 2012-03-19 DIAGNOSIS — Z8679 Personal history of other diseases of the circulatory system: Secondary | ICD-10-CM | POA: Insufficient documentation

## 2012-03-19 DIAGNOSIS — R002 Palpitations: Secondary | ICD-10-CM | POA: Insufficient documentation

## 2012-03-19 DIAGNOSIS — Z8739 Personal history of other diseases of the musculoskeletal system and connective tissue: Secondary | ICD-10-CM | POA: Insufficient documentation

## 2012-03-19 DIAGNOSIS — K219 Gastro-esophageal reflux disease without esophagitis: Secondary | ICD-10-CM | POA: Insufficient documentation

## 2012-03-19 DIAGNOSIS — E785 Hyperlipidemia, unspecified: Secondary | ICD-10-CM | POA: Insufficient documentation

## 2012-03-19 LAB — COMPREHENSIVE METABOLIC PANEL
AST: 25 U/L (ref 0–37)
Albumin: 3.6 g/dL (ref 3.5–5.2)
Alkaline Phosphatase: 83 U/L (ref 39–117)
Chloride: 103 mEq/L (ref 96–112)
Creatinine, Ser: 1.03 mg/dL (ref 0.50–1.35)
Potassium: 3.9 mEq/L (ref 3.5–5.1)
Total Bilirubin: 0.6 mg/dL (ref 0.3–1.2)
Total Protein: 6.4 g/dL (ref 6.0–8.3)

## 2012-03-19 LAB — CBC WITH DIFFERENTIAL/PLATELET
Basophils Absolute: 0.1 10*3/uL (ref 0.0–0.1)
Basophils Relative: 1 % (ref 0–1)
Eosinophils Absolute: 0.1 10*3/uL (ref 0.0–0.7)
MCH: 33.3 pg (ref 26.0–34.0)
MCHC: 37 g/dL — ABNORMAL HIGH (ref 30.0–36.0)
Neutro Abs: 6.7 10*3/uL (ref 1.7–7.7)
Neutrophils Relative %: 67 % (ref 43–77)
RDW: 12.5 % (ref 11.5–15.5)

## 2012-03-19 LAB — POCT I-STAT TROPONIN I: Troponin i, poc: 0.01 ng/mL (ref 0.00–0.08)

## 2012-03-19 MED ORDER — SODIUM CHLORIDE 0.9 % IV SOLN: INTRAVENOUS | Status: DC

## 2012-03-19 NOTE — ED Notes (Signed)
Per ems- Pt was working in the yard, ate ham sandwiches, walked to [employer]. Got pain sensation in throat then felt heart rate increase to 210. Went to PCP HR was 163. Given 324 aspirin. HR 90-103 with EMS AFIB. EKG showing LBBB. Denying any pain or sob at this time. Hx of ablation for AFIB. BP 125/74, 95% RA, RR 18. 20 L. AC. Alert and oriented.

## 2012-03-19 NOTE — ED Provider Notes (Addendum)
History     CSN: [id number]  Arrival date & time 03/19/12  1635   First MD Initiated Contact with Patient 03/19/12 1653      Chief Complaint  Patient presents with  . Chest Pain    (Consider location/radiation/quality/duration/timing/severity/associated sxs/prior treatment) Patient is a 72 y.o. male presenting with chest pain. The history is provided by the patient.  Chest Pain  patient here complaining of palpitations that started suddenly. History of atrial fibrillation and he had an ablation for this 4 years ago is not has symptoms since. Denies any anginal type chest pain today but did have palpitations. Denies any syncope or near-syncope. Symptoms resolved spontaneously after about 30 minutes. He feels back to his baseline at this time.   Past Medical History  Diagnosis Date  . Aortic stenosis     Moderate per echo in August of 2011.   Marland Kitchen LBBB (left bundle branch block)     Has dyssynchrony but not felt to be significant enough to warrant resynchronization therapy per [facility].   . Coronary artery disease     Minimal per cath in 2010  . S/P AV nodal ablation 2008 AND 2009  . Fatigue     Chronic  . SVT (supraventricular tachycardia)   . PVC's (premature ventricular contractions)   . GERD (gastroesophageal reflux disease)   . Hyperlipidemia   . Stricture and stenosis of esophagus 2006    EGD  . Hiatal hernia 2006    EGD   . Allergy   . Arthritis     thumbs  . Cancer     Past Surgical History  Procedure Laterality Date  . Cardiac catheterization  01/27/2008    EF 45%; 30% prox LAD, 20 to 30% RCA  . Cardiac catheterization  07/28/2003  . US echocardiography  08/20/2009    EF 55-60%; Moderate LVH, septal bounce consistent with LBBB, calcified aortic valve with moderate AS/AI  . Hernia repair    . Cataracts      both eyes  . Cardiac electrophysiology study and ablation      Family History  Problem Relation Age of Onset  . Heart attack Father   . Heart disease  Brother   . Colon cancer Neg Hx   . Esophageal cancer Neg Hx   . Stomach cancer Neg Hx   . Rectal cancer Neg Hx     History  Substance Use Topics  . Smoking status: Former Smoker    Quit date: 01/07/1984  . Smokeless tobacco: Never Used  . Alcohol Use: No     Comment: limited      Review of Systems  Cardiovascular: Positive for chest pain.  All other systems reviewed and are negative.    Allergies  Atorvastatin and Crestor  Home Medications   Current Outpatient Rx  Name  Route  Sig  Dispense  Refill  . acetaminophen (TYLENOL) 500 MG tablet   Oral   Take 500 mg by mouth every 6 (six) hours as needed.         Marland Kitchen aspirin 81 MG tablet   Oral   Take 81 mg by mouth daily.           . calcipotriene-betamethasone (TACLONEX) ointment   Topical   Apply 1 application topically as needed.          . carvedilol (COREG) 6.25 MG tablet   Oral   Take 1 tablet (6.25 mg total) by mouth 2 (two) times daily.   60 tablet  5   . Coenzyme Q10 (CO Q 10 PO)   Oral   Take by mouth 2 (two) times daily.         . diazepam (VALIUM) 5 MG tablet   Oral   Take 5 mg by mouth as needed.          Marland Kitchen esomeprazole (NEXIUM) 40 MG capsule   Oral   Take 40 mg by mouth daily before breakfast.           . latanoprost (XALATAN) 0.005 % ophthalmic solution      1 drop at bedtime.         . rosuvastatin (CRESTOR) 5 MG tablet      Take one tablet/ 5mg  a week.   5 tablet   5   . VIAGRA 100 MG tablet   Oral   Take 100 mg by mouth as needed.            There were no vitals taken for this visit.  Physical Exam  Nursing note and vitals reviewed. Constitutional: He is oriented to person, place, and time. He appears well-developed and well-nourished.  Non-toxic appearance. No distress.  HENT:  Head: Normocephalic and atraumatic.  Eyes: Conjunctivae, EOM and lids are normal. Pupils are equal, round, and reactive to light.  Neck: Normal range of motion. Neck supple. No  tracheal deviation present. No mass present.  Cardiovascular: Normal rate, regular rhythm and normal heart sounds.  Exam reveals no gallop.   No murmur heard. Pulmonary/Chest: Effort normal and breath sounds normal. No stridor. No respiratory distress. He has no decreased breath sounds. He has no wheezes. He has no rhonchi. He has no rales.  Abdominal: Soft. Normal appearance and bowel sounds are normal. He exhibits no distension. There is no tenderness. There is no rebound and no CVA tenderness.  Musculoskeletal: Normal range of motion. He exhibits no edema and no tenderness.  Neurological: He is alert and oriented to person, place, and time. He has normal strength. No cranial nerve deficit or sensory deficit. GCS eye subscore is 4. GCS verbal subscore is 5. GCS motor subscore is 6.  Skin: Skin is warm and dry. No abrasion and no rash noted.  Psychiatric: He has a normal mood and affect. His speech is normal and behavior is normal.    ED Course  Procedures (including critical care time)  Labs Reviewed - No data to display No results found.   No diagnosis found.    MDM   Date: 03/19/2012  Rate: 98  Rhythm: normal sinus rhythm  QRS Axis: normal  Intervals: normal  ST/T Wave abnormalities: nonspecific ST changes  Conduction Disutrbances:nonspecific intraventricular conduction delay  Narrative Interpretation:   Old EKG Reviewed: unchanged   Labs and x-rays normal here. Discuss with cardiology and patient will be given strict return instructions and will see his cardiologist on Monday  No concern for ACS at this time. Suspect that patient's chest discomfort is from his rapid heartbeat. He denies any anginal type chest pain  Pt will start his coreg today     Toy Baker, MD 03/19/12 1907  Toy Baker, MD 03/19/12 1909  Toy Baker, MD 03/19/12 [phone]

## 2012-03-19 NOTE — Discharge Instructions (Signed)
Followup with your Dr. Monday. Return here at once if you feel your heart racing again Atrial Fibrillation Your caregiver has diagnosed you with atrial fibrillation (AFib). The heart normally beats very regularly; AFib is a type of irregular heartbeat. The heart rate may be faster or slower than normal. This can prevent your heart from pumping as well as it should. AFib can be constant (chronic) or intermittent (paroxysmal). CAUSES  Atrial fibrillation may be caused by:  Heart disease, including heart attack, coronary artery disease, heart failure, diseases of the heart valves, and others.  Blood clot in the lungs (pulmonary embolism).  Pneumonia or other infections.  Chronic lung disease.  Thyroid disease.  Toxins. These include alcohol, some medications (such as decongestant medications or diet pills), and caffeine. In some people, no cause for AFib can be found. This is referred to as Lone Atrial Fibrillation. SYMPTOMS   Palpitations or a fluttering in your chest.  A vague sense of chest discomfort.  Shortness of breath.  Sudden onset of lightheadedness or weakness. Sometimes, the first sign of AFib can be a complication of the condition. This could be a stroke or heart failure. DIAGNOSIS  Your description of your condition may make your caregiver suspicious of atrial fibrillation. Your caregiver will examine your pulse to determine if fibrillation is present. An EKG (electrocardiogram) will confirm the diagnosis. Further testing may help determine what caused you to have atrial fibrillation. This may include chest x-ray, echocardiogram, blood tests, or CT scans. PREVENTION  If you have previously had atrial fibrillation, your caregiver may advise you to avoid substances known to cause the condition (such as stimulant medications, and possibly caffeine or alcohol). You may be advised to use medications to prevent recurrence. Proper treatment of any underlying condition is important  to help prevent recurrence. PROGNOSIS  Atrial fibrillation does tend to become a chronic condition over time. It can cause significant complications (see below). Atrial fibrillation is not usually immediately life-threatening, but it can shorten your life expectancy. This seems to be worse in women. If you have lone atrial fibrillation and are under 69 years old, the risk of complications is very low, and life expectancy is not shortened. RISKS AND COMPLICATIONS  Complications of atrial fibrillation can include stroke, chest pain, and heart failure. Your caregiver will recommend treatments for the atrial fibrillation, as well as for any underlying conditions, to help minimize risk of complications. TREATMENT  Treatment for AFib is divided into several categories:  Treatment of any underlying condition.  Converting you out of AFib into a regular (sinus) rhythm.  Controlling rapid heart rate.  Prevention of blood clots and stroke. Medications and procedures are available to convert your atrial fibrillation to sinus rhythm. However, recent studies have shown that this may not offer you any advantage, and cardiac experts are continuing research and debate on this topic. More important is controlling your rapid heartbeat. The rapid heartbeat causes more symptoms, and places strain on your heart. Your caregiver will advise you on the use of medications that can control your heart rate. Atrial fibrillation is a strong stroke risk. You can lessen this risk by taking blood thinning medications such as Coumadin (warfarin), or sometimes aspirin. These medications need close monitoring by your caregiver. Over-medication can cause bleeding. Too little medication may not protect against stroke. HOME CARE INSTRUCTIONS   If your caregiver prescribed medicine to make your heartbeat more normally, take as directed.  If blood thinners were prescribed by your caregiver, take EXACTLY as  directed.  Perform blood  tests EXACTLY as directed.  Quit smoking. Smoking increases your cardiac and lung (pulmonary) risks.  DO NOT drink alcohol.  DO NOT drink caffeinated drinks (e.g. coffee, soda, chocolate, and leaf teas). You may drink decaffeinated coffee, soda or tea.  If you are overweight, you should choose a reduced calorie diet to lose weight. Please see a registered dietitian if you need more information about healthy weight loss. DO NOT USE DIET PILLS as they may aggravate heart problems.  If you have other heart problems that are causing AFib, you may need to eat a low salt, fat, and cholesterol diet. Your caregiver will tell you if this is necessary.  Exercise every day to improve your physical fitness. Stay active unless advised otherwise.  If your caregiver has given you a follow-up appointment, it is very important to keep that appointment. Not keeping the appointment could result in heart failure or stroke. If there is any problem keeping the appointment, you must call back to this facility for assistance. SEEK MEDICAL CARE IF:  You notice a change in the rate, rhythm or strength of your heartbeat.  You develop an infection or any other change in your overall health status. SEEK IMMEDIATE MEDICAL CARE IF:   You develop chest pain, abdominal pain, sweating, weakness or feel sick to your stomach (nausea).  You develop shortness of breath.  You develop swollen feet and ankles.  You develop dizziness, numbness, or weakness of your face or limbs, or any change in vision or speech. MAKE SURE YOU:   Understand these instructions.  Will watch your condition.  Will get help right away if you are not doing well or get worse. Document Released: 12/23/2004 Document Revised: 03/17/2011 Document Reviewed: 07/28/2007 [facility] Patient Information 2013 [place], [place].

## 2012-03-19 NOTE — Telephone Encounter (Signed)
Phone call [facility]/ [facility], pt in afib rvr @170 , asa 324 mg was given, pt sob w/ cp, had viagra last night no nitro given, PA asked where to send pt, told them the [facility] ED. Dr Elease Hashimoto updated.

## 2012-07-29 ENCOUNTER — Ambulatory Visit: Payer: Medicare Other | Attending: Orthopedic Surgery | Admitting: Physical Therapy

## 2012-07-29 DIAGNOSIS — IMO0001 Reserved for inherently not codable concepts without codable children: Secondary | ICD-10-CM | POA: Insufficient documentation

## 2012-07-29 DIAGNOSIS — M25579 Pain in unspecified ankle and joints of unspecified foot: Secondary | ICD-10-CM | POA: Insufficient documentation

## 2012-08-03 ENCOUNTER — Ambulatory Visit: Payer: Medicare Other | Admitting: Physical Therapy

## 2012-08-09 ENCOUNTER — Ambulatory Visit: Payer: Medicare Other | Attending: Orthopedic Surgery | Admitting: Physical Therapy

## 2012-08-09 DIAGNOSIS — IMO0001 Reserved for inherently not codable concepts without codable children: Secondary | ICD-10-CM | POA: Insufficient documentation

## 2012-08-09 DIAGNOSIS — M25579 Pain in unspecified ankle and joints of unspecified foot: Secondary | ICD-10-CM | POA: Insufficient documentation

## 2012-08-11 ENCOUNTER — Other Ambulatory Visit: Payer: Self-pay

## 2012-10-18 ENCOUNTER — Other Ambulatory Visit: Payer: Self-pay | Admitting: Cardiovascular Disease

## 2012-10-18 MED ORDER — CARVEDILOL 3.125 MG PO TABS: 3.1250 mg | ORAL_TABLET | Freq: Two times a day (BID) | ORAL | Status: DC

## 2012-11-11 ENCOUNTER — Other Ambulatory Visit: Payer: Self-pay

## 2012-11-29 ENCOUNTER — Other Ambulatory Visit: Payer: Self-pay | Admitting: Cardiovascular Disease

## 2013-02-09 ENCOUNTER — Encounter: Payer: Self-pay | Admitting: Cardiology

## 2013-04-07 ENCOUNTER — Other Ambulatory Visit: Payer: Self-pay | Admitting: Cardiovascular Disease

## 2013-05-13 ENCOUNTER — Other Ambulatory Visit: Payer: Self-pay | Admitting: Cardiovascular Disease

## 2013-09-29 IMAGING — CR DG CHEST 2V
2 series · 2 of 2 positions shown · non-contrast
Comparison: None.

CLINICAL DATA: Chest pain.  Tachycardia.

CHEST - 2 VIEW

[w chest pa]
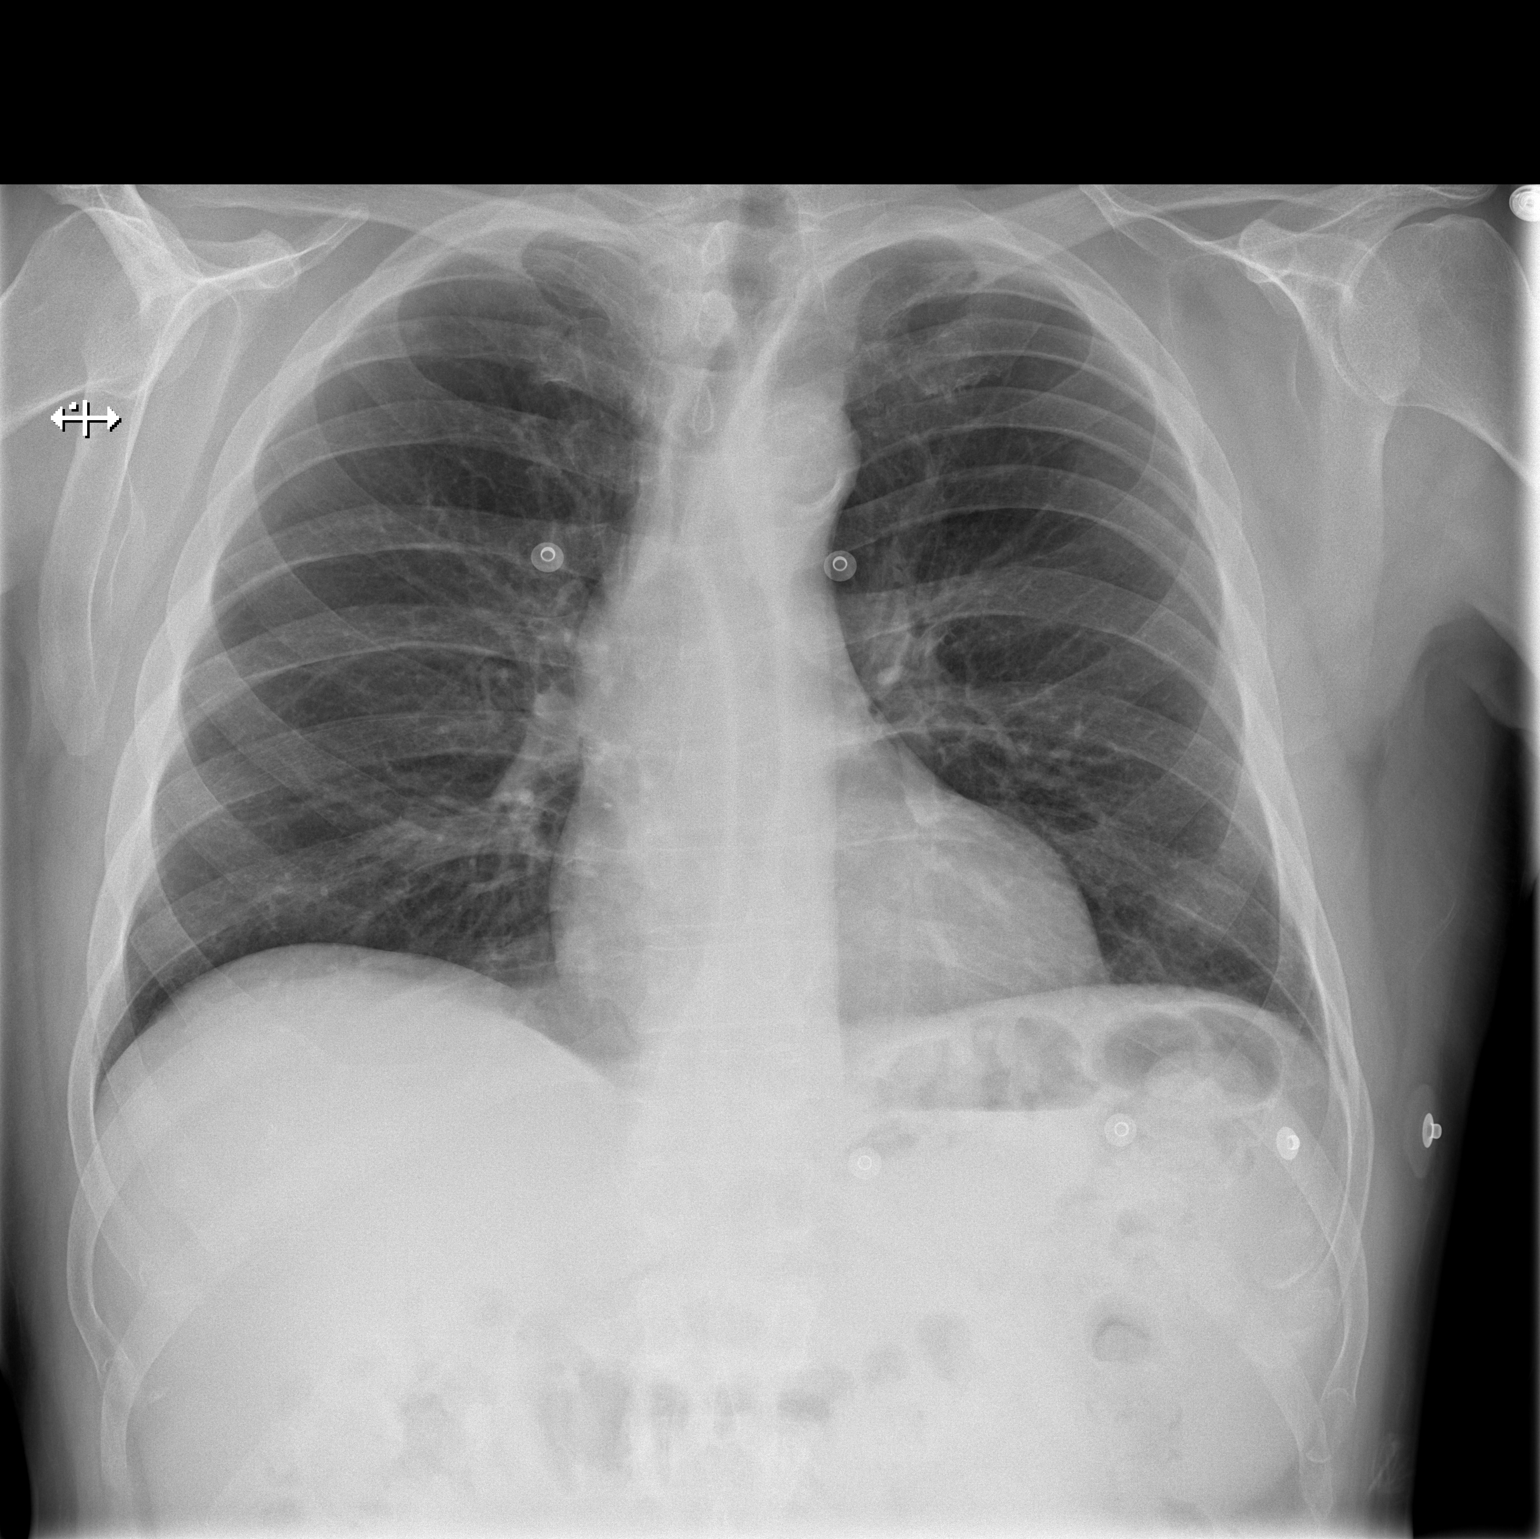

[w chest lat]
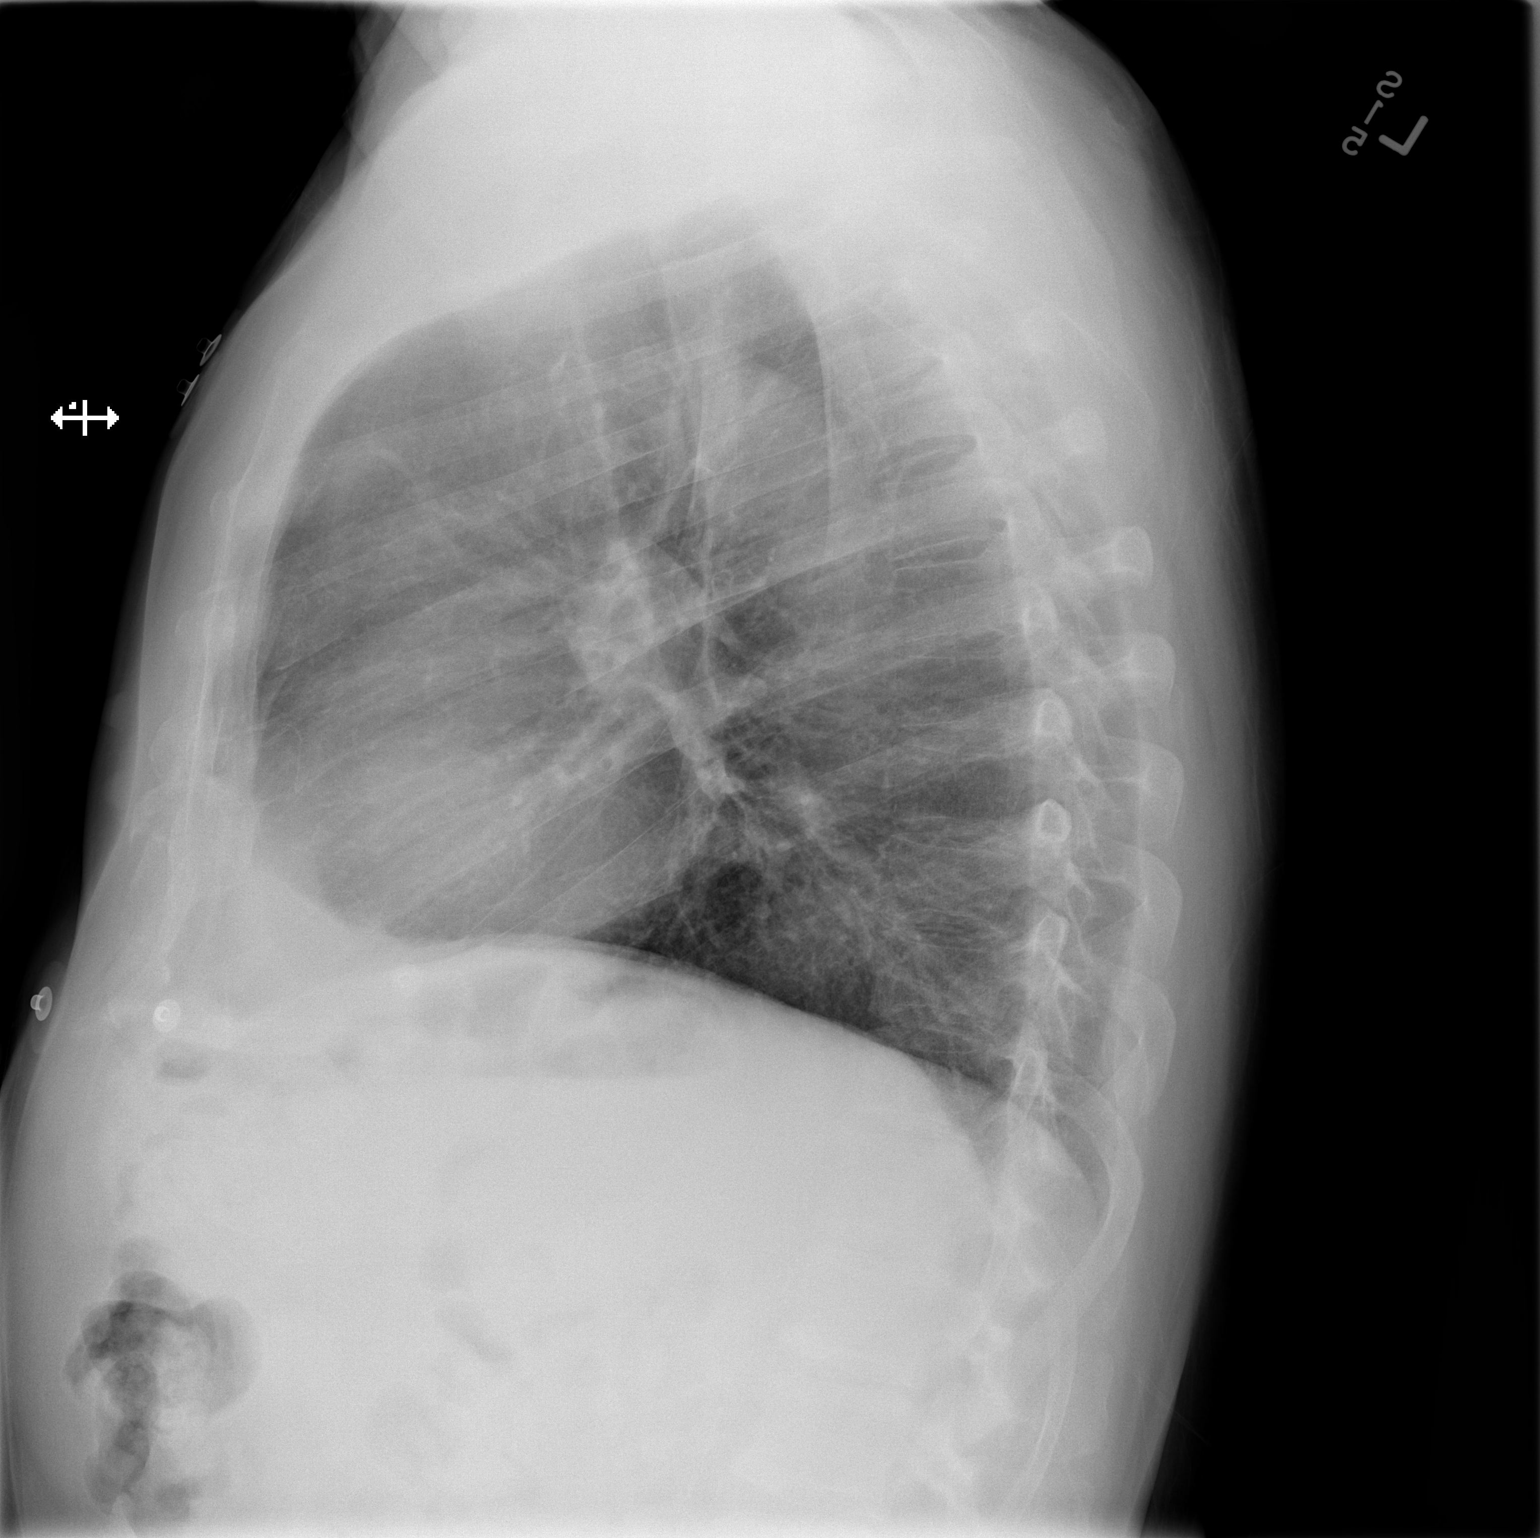

[2 of 2 positions shown; findings below may reference images not displayed]

FINDINGS: The heart size and mediastinal contours are within
normal limits.  Both lungs are clear.  The visualized skeletal
structures are unremarkable.
IMPRESSION: No active cardiopulmonary disease.

## 2013-10-10 ENCOUNTER — Other Ambulatory Visit (HOSPITAL_COMMUNITY): Payer: Self-pay | Admitting: Urology

## 2013-10-10 DIAGNOSIS — R972 Elevated prostate specific antigen [PSA]: Secondary | ICD-10-CM

## 2013-10-20 ENCOUNTER — Ambulatory Visit (HOSPITAL_COMMUNITY)
Admission: RE | Admit: 2013-10-20 | Discharge: 2013-10-20 | Disposition: A | Discharge status: Home / Self Care | Payer: Medicare Other | Source: Ambulatory Visit | Attending: Urology | Admitting: Urology

## 2013-10-20 DIAGNOSIS — R972 Elevated prostate specific antigen [PSA]: Secondary | ICD-10-CM | POA: Diagnosis present

## 2013-10-20 LAB — POCT I-STAT CREATININE: CREATININE: 1 mg/dL (ref 0.50–1.35)

## 2013-10-20 MED ORDER — GADOBENATE DIMEGLUMINE 529 MG/ML IV SOLN
20.0000 mL | Freq: Once | INTRAVENOUS | Status: AC | PRN
  Administered 2013-10-20: 16 mL via INTRAVENOUS

## 2013-11-11 DIAGNOSIS — C61 Malignant neoplasm of prostate: Secondary | ICD-10-CM

## 2013-11-11 HISTORY — PX: PROSTATE BIOPSY: SHX241

## 2013-11-11 HISTORY — DX: Malignant neoplasm of prostate: C61

## 2013-12-09 ENCOUNTER — Encounter: Payer: Self-pay | Admitting: *Deleted

## 2013-12-13 ENCOUNTER — Encounter: Payer: Self-pay | Admitting: Radiation Oncology

## 2013-12-13 NOTE — Progress Notes (Signed)
GU Location of Tumor / Histology: prostate adenocarcinoma  If Prostate Cancer, Gleason Score is (3 + 4) and PSA is (8.49 on 09/30/13)  Elmyra Ricks presented 1 months ago with signs/symptoms of: elevated PSA  Biopsies of  prostate revealed:  11/11/13  Volume 36.94 cc   Past/Anticipated interventions by urology, if any: MRI of prostate, biopsy  Past/Anticipated interventions by medical oncology, if any: no  Weight changes, if any: no  Bowel/Bladder complaints, if any:  IPSS 5   Nausea/Vomiting, if any: no  Pain issues, if any:  Thumbs- chronic arthritis pain SAFETY ISSUES:  Prior radiation? no  Pacemaker/ICD? no  Possible current pregnancy? na  Is the patient on methotrexate? no  Current Complaints / other details:  Married, retired Clinical biochemist for residential and commercial  Second opinion re: radiation therapies

## 2013-12-14 ENCOUNTER — Ambulatory Visit
Admission: RE | Admit: 2013-12-14 | Discharge: 2013-12-14 | Disposition: A | Discharge status: Home / Self Care | Payer: Medicare Other | Source: Ambulatory Visit | Attending: Radiation Oncology | Admitting: Radiation Oncology

## 2013-12-14 ENCOUNTER — Encounter: Payer: Self-pay | Admitting: Radiation Oncology

## 2013-12-14 VITALS — BP 150/76 | HR 63 | Temp 97.6°F | Resp 20 | Ht 66.0 in | Wt 182.6 lb

## 2013-12-14 DIAGNOSIS — C61 Malignant neoplasm of prostate: Secondary | ICD-10-CM | POA: Insufficient documentation

## 2013-12-14 HISTORY — DX: Unspecified glaucoma: H40.9

## 2013-12-14 HISTORY — DX: Essential (primary) hypertension: I10

## 2013-12-14 HISTORY — DX: Unspecified atrial fibrillation: I48.91

## 2013-12-14 HISTORY — DX: Serous retinal detachment, unspecified eye: H33.20

## 2013-12-14 NOTE — Progress Notes (Signed)
[facility] NEW PATIENT EVALUATION  Name: Jose Petty MRN: [id number]  Date:   12/14/2013           DOB: 1940-08-17  Status: outpatient   CC: Phineas Inches, MD  Ailene Rud, * , Dr. 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 male who is seen today through the courtesy of Dr. 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  His PSA in February 2009 was 2.07.  This slowly rose to 5.2 by June 2014, and most recently 8.49 on 09/30/2013.  He was seen by Dr. Gaynelle Arabian who performed ultrasound-guided biopsies on 11/11/2013.  He was found have Gleason 7 (3+4) involving 2 cores from the left lateral base with 90%, and 80% involvement.  He also had Gleason 6 (3+3) involving less than 5% of one core from right lateral apex, 80% of one core from left base, 20% of one core from the left lateral mid gland and 50% of one core from the left mid gland.  He had other areas of PIN and atypia.  His gland volume was approximately 37 mL.  He is doing well from a GU and GI standpoint.  His I PSS score today is 5.  He does have erectile dysfunction which is not a major concern for him.  He met with Dr. Alinda Money yesterday for discussion of robotic prostatectomy.  He will meet with Dr. Gaynelle Arabian next week.  PREVIOUS RADIATION THERAPY: No   PAST MEDICAL HISTORY:  has a past medical history of Aortic stenosis; LBBB (left bundle branch block); Coronary artery disease; S/P AV nodal ablation (2008 AND 2009); Fatigue; SVT (supraventricular tachycardia); PVC's (premature ventricular contractions); GERD (gastroesophageal reflux disease); Hyperlipidemia; Stricture and stenosis of esophagus (2006); Hiatal hernia (2006); Allergy; Arthritis; Cancer; Adenocarcinoma of prostate (11/11/13); Atrial fibrillation; Glaucoma;  Hypertension; and Retinal detachment.     PAST SURGICAL HISTORY:  Past Surgical History  Procedure Laterality Date  . Cardiac catheterization  01/27/2008    EF 45%; 30% prox LAD, 20 to 30% RCA  . Cardiac catheterization  07/28/2003  . US echocardiography  08/20/2009    EF 55-60%; Moderate LVH, septal bounce consistent with LBBB, calcified aortic valve with moderate AS/AI  . Hernia repair    . Cataracts      both eyes  . Cardiac electrophysiology study and ablation    . Prostate biopsy  11/11/13    gleason 7, volume 36.94 cc     FAMILY HISTORY: family history includes Alzheimer's disease in his mother; Heart attack in his brother and father; Heart disease in his brother; Leukemia in his brother. There is no history of Colon cancer, Esophageal cancer, Stomach cancer, or Rectal cancer.  His father died of a heart attack at 45 and his mother died from complications of Alzheimer's disease at 30.  No family history of prostate cancer.   SOCIAL HISTORY:  reports that he quit smoking about 29 years ago. He has never used smokeless tobacco. He reports that he does not drink alcohol or use illicit drugs.  Married, 2 children.  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   ALLERGIES: Atorvastatin; Bactrim; Bee venom; Cephalexin; Crestor; Doxycycline; and Quinolones   MEDICATIONS:  Current Outpatient Prescriptions  Medication Sig Dispense Refill  . aspirin 81 MG  tablet Take 81 mg by mouth daily.      . carvedilol (COREG) 3.125 MG tablet TAKE 1 TABLET BY MOUTH TWICE DAILY 14 tablet 0  . Coenzyme Q10 (CO Q 10 PO) Take by mouth 2 (two) times daily.    . diazepam (VALIUM) 5 MG tablet Take 5 mg by mouth daily as needed (for vertigo).     Marland Kitchen diltiazem (CARDIZEM CD) 120 MG 24 hr capsule Take 120 mg by mouth.    . esomeprazole (NEXIUM) 40 MG capsule Take 40 mg by mouth daily before breakfast.      . latanoprost (XALATAN) 0.005 % ophthalmic solution Place 1 drop into the left  eye at bedtime.     . naproxen sodium (ANAPROX) 220 MG tablet Take 220 mg by mouth 2 (two) times daily as needed (for pain).    . rosuvastatin (CRESTOR) 5 MG tablet Take 5 mg by mouth once a week. No specific day of the week,    . VIAGRA 100 MG tablet Take 100 mg by mouth daily as needed for erectile dysfunction.      No current facility-administered medications for this encounter.     REVIEW OF SYSTEMS:  Pertinent items are noted in HPI.    PHYSICAL EXAM:  height is '5\' 6"'  (1.676 m) and weight is 182 lb 9.6 oz (82.827 kg). His oral temperature is 97.6 F (36.4 C). His blood pressure is 150/76 and his pulse is 63. His respiration is 20.   Alert and oriented 73 year old white male appearing younger than his stated age.  Head and neck examination: Grossly unremarkable.  Nodes: Without palpable cervical or supraclavicular lymphadenopathy.  Chest: Lungs clear.  Abdomen: Without masses organomegaly.  Genitalia: Unremarkable to inspection.  Rectal: The prostate gland is normal in size and is without focal induration or nodularity.  Extremities: Without edema.   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  I explained to the patient and his wife that his prognosis is related to his stage, PSA level, and Gleason score.  His stage and PSA level are favorable while his Gleason score 7 is of intermediate favorability.  Management options include robotic surgery versus 5 weeks of external beam radiation therapy followed by seed implantation or 8  weeks of external beam/IMRT.  We discussed the potential acute and late toxicities of radiation therapy.  We also discussed bladder filling with IMRT to minimize urethral toxicity.  His prognosis is favorable with either surgery or radiation therapy.  Considering his age and cardiac comorbidity, he may be better suited for external beam/IMRT.   PLAN: As discussed above.  He can contact me if he is interested in radiation therapy.  I spent 60  minutes face to face with the patient and more than 50% of that time was spent in counseling and/or coordination of care.

## 2013-12-14 NOTE — Progress Notes (Signed)
Please see the Nurse Progress Note in the MD Initial Consult Encounter for this patient. 

## 2014-01-11 ENCOUNTER — Encounter: Payer: Self-pay | Admitting: Radiation Oncology

## 2014-01-11 NOTE — Progress Notes (Signed)
CC: Dr. Katrine Coho  I had not heard back from Mr. Croll since I saw him in consultation one month ago.  I gave him a call this evening.  He is still exploring his options and is consult taking physicians in [place] for the time being.  I told him that I would I happy to speak with him again if he has any specific questions.  I encouraged him to make sure that he keeps up with Dr. Gaynelle Arabian regardless of his therapy decision.

## 2014-07-03 ENCOUNTER — Other Ambulatory Visit: Payer: Self-pay

## 2015-08-16 ENCOUNTER — Encounter: Payer: Self-pay | Admitting: Physical Therapy

## 2015-08-16 ENCOUNTER — Ambulatory Visit: Payer: Medicare Other | Attending: Family Medicine | Admitting: Physical Therapy

## 2015-08-16 DIAGNOSIS — R262 Difficulty in walking, not elsewhere classified: Secondary | ICD-10-CM | POA: Insufficient documentation

## 2015-08-16 DIAGNOSIS — M25562 Pain in left knee: Secondary | ICD-10-CM | POA: Diagnosis present

## 2015-08-16 DIAGNOSIS — M25662 Stiffness of left knee, not elsewhere classified: Secondary | ICD-10-CM | POA: Insufficient documentation

## 2015-08-16 NOTE — Therapy (Signed)
[address] [place], [place], [address] Phone: [phone]   Fax:  [phone]  Physical Therapy Evaluation  Patient Details  Name: Jose Petty MRN: [id number] Date of Birth: 1940/05/27 Referring Provider: Coletta Memos  Encounter Date: 08/16/2015      PT End of Session - 08/16/15 1548    Visit Number 1   Date for PT Re-Evaluation 10/16/15   PT Start Time 1519   PT Stop Time 1608   PT Time Calculation (min) 49 min   Activity Tolerance Patient tolerated treatment well   Behavior During Therapy [facility] for tasks assessed/performed      Past Medical History:  Diagnosis Date  . Adenocarcinoma of prostate ([place]) 11/11/13   Gleason 7  . Allergy   . Aortic stenosis    Moderate per echo in August of 2011.   . Arthritis    thumbs  . Atrial fibrillation ([place])   . Cancer ([place])   . Coronary artery disease    Minimal per cath in 2010  . Fatigue    Chronic  . GERD (gastroesophageal reflux disease)   . Glaucoma   . Hiatal hernia 2006   EGD   . Hyperlipidemia   . Hypertension   . LBBB (left bundle branch block)    Has dyssynchrony but not felt to be significant enough to warrant resynchronization therapy per [facility].   . PVC's (premature ventricular contractions)   . Retinal detachment   . S/P AV nodal ablation 2008 AND 2009  . Stricture and stenosis of esophagus 2006   EGD  . SVT (supraventricular tachycardia) ([facility])     Past Surgical History:  Procedure Laterality Date  . CARDIAC CATHETERIZATION  01/27/2008   EF 45%; 30% prox LAD, 20 to 30% RCA  . CARDIAC CATHETERIZATION  07/28/2003  . CARDIAC ELECTROPHYSIOLOGY STUDY AND ABLATION    . cataracts     both eyes  . HERNIA REPAIR    . PROSTATE BIOPSY  11/11/13   gleason 7, volume 36.94 cc  . US ECHOCARDIOGRAPHY  08/20/2009   EF 55-60%; Moderate LVH, septal bounce consistent with LBBB, calcified aortic valve with moderate AS/AI    There were no vitals filed  for this visit.       Subjective Assessment - 08/16/15 1520    Subjective Patient reports a left hamstring tear 2 weeks ago, he reports a fall when his leg got stuck inthe mud.  He has been wearing a neoprene sleeve recently that has helped.     Limitations Lifting;Standing;Walking;House hold activities   Patient Stated Goals have less pain and walk better   Currently in Pain? Yes   Pain Score 3    Pain Location Leg   Pain Orientation Left;Posterior   Pain Descriptors / Indicators Sore;Tender;Aching   Pain Type Acute pain   Pain Onset 1 to 4 weeks ago   Pain Frequency Constant   Aggravating Factors  worse with pressure, worse with extending the knee up to 7/10   Pain Relieving Factors rest helps as does pain meds, at best the pain is a 2/10   Effect of Pain on Daily Activities less pain and better gait            [facility] PT Assessment - 08/16/15 0001      Assessment   Medical Diagnosis left hamstring tear   Referring Provider [place]   Onset Date/Surgical Date 08/02/15   Prior Therapy no  Precautions   Precautions None     Balance Screen   Has the patient fallen in the past 6 months Yes   How many times? 1   Has the patient had a decrease in activity level because of a fear of falling?  No   Is the patient reluctant to leave their home because of a fear of falling?  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     ROM / Strength   AROM / PROM / Strength AROM;Strength;PROM     AROM   Overall AROM Comments AROM of the left knee 30-106 degrees flexion, very painful with extension     PROM   Overall PROM Comments PROM of the left knee 10-110 degrees flexion     Strength   Overall Strength Comments stength extension 4-/5, flexion 3+/5 iwth pain for flexion     Flexibility   Soft Tissue Assessment /Muscle Length --  very tight and  painful HS tightness     Palpation   Palpation comment he is very tender at the originm middle and distal left HS     Ambulation/Gait   Gait Comments no device, mild antalgic gait on the left,, decreased heel strike                   [facility] Adult PT Treatment/Exercise - 08/16/15 0001      Modalities   Modalities Electrical Stimulation;Moist Heat     Moist Heat Therapy   Number Minutes Moist Heat 15 Minutes   Moist Heat Location Hip     Electrical Stimulation   Electrical Stimulation Location left posterior thigh at HS origin   Electrical Stimulation Action IFC   Electrical Stimulation Parameters supine with leg in elevation   Electrical Stimulation Goals Pain                PT Education - 08/16/15 1546    Education provided Yes   Education Details gave a gentle HS stretch   Person(s) Educated Patient   Methods Explanation;Demonstration   Comprehension Verbalized understanding          PT Short Term Goals - 08/16/15 1552      PT SHORT TERM GOAL #1   Title independent with intial HEP   Time 2   Period Weeks   Status New           PT Long Term Goals - 08/16/15 1553      PT LONG TERM GOAL #1   Title decrease pain 50%   Time 8   Period Weeks   Status New     PT LONG TERM GOAL #2   Title increase left knee ROM to WNL's   Time 8   Period Weeks   Status New     PT LONG TERM GOAL #3   Title increase left knee strength to 4+/5   Time 8   Period Weeks   Status New     PT LONG TERM GOAL #4   Title walk all distances without difficulty   Time 8   Period Weeks   Status New     PT LONG TERM GOAL #5   Title up and down stairs without difficulty   Time 8   Period Weeks   Status New  Plan - 08/16/15 1549    Clinical Impression Statement Patient reports his foot was stuck in the mud and he had a fall about two weeks ago, he has been diagnosed with a left hamstring tear.  He is very tender at the HS origin, mid  muscle and distal area, has mild antalgic gait   Rehab Potential Good   PT Frequency 2x / week   PT Duration 8 weeks   PT Treatment/Interventions ADLs/Self Care Home Management;Cryotherapy;Electrical Stimulation;Iontophoresis 4mg /ml Dexamethasone;Functional mobility training;Stair training;Gait training;Ultrasound;Moist Heat;Therapeutic activities;Therapeutic exercise;Balance training;Patient/family education;Manual techniques   PT Next Visit Plan try to get him moving genlty, modalities as needed    Consulted and Agree with Plan of Care Patient      Patient will benefit from skilled therapeutic intervention in order to improve the following deficits and impairments:  Abnormal gait, Decreased balance, Decreased mobility, Decreased range of motion, Decreased strength, Difficulty walking, Impaired flexibility, Improper body mechanics, Pain  Visit Diagnosis: Pain in left knee - Plan: PT plan of care cert/re-cert  Stiffness of left knee, not elsewhere classified - Plan: PT plan of care cert/re-cert  Difficulty in walking, not elsewhere classified - Plan: PT plan of care cert/re-cert      G-Codes - [id number]    Functional Assessment Tool Used foto 59% limitation   Functional Limitation Mobility: Walking and moving around   Mobility: Walking and Moving Around Current Status [phone]) At least 40 percent but less than 60 percent impaired, limited or restricted   Mobility: Walking and Moving Around Goal Status [phone]) At least 20 percent but less than 40 percent impaired, limited or restricted       Problem List Patient Active Problem List   Diagnosis Date Noted  . Malignant neoplasm of prostate ([place]) 12/14/2013  . HTN (hypertension) 02/24/2011  . CHF (congestive heart failure) ([place]) 02/24/2011  . AS (aortic stenosis) 10/28/2010  . CAD (coronary artery disease) 10/28/2010  . Hyperlipidemia 10/28/2010  . Fatigue 10/28/2010    Sumner Boast., PT 08/16/2015, 4:05 PM  [address] [place], [place], [address] Phone: [phone]   Fax:  [phone]  Name: Jose Petty MRN: [id number] Date of Birth: September 05, 1940

## 2015-08-21 ENCOUNTER — Ambulatory Visit: Payer: Medicare Other | Admitting: Physical Therapy

## 2015-08-21 ENCOUNTER — Encounter: Payer: Self-pay | Admitting: Physical Therapy

## 2015-08-21 DIAGNOSIS — M25562 Pain in left knee: Secondary | ICD-10-CM

## 2015-08-21 DIAGNOSIS — R262 Difficulty in walking, not elsewhere classified: Secondary | ICD-10-CM

## 2015-08-21 DIAGNOSIS — M25662 Stiffness of left knee, not elsewhere classified: Secondary | ICD-10-CM

## 2015-08-21 NOTE — Therapy (Signed)
[place], [place], [address] Phone: [phone]   Fax:  [phone]  Physical Therapy Treatment  Patient Details  Name: Jose Petty MRN: [id number] Date of Birth: 08/23/1940 Referring Provider: Coletta Memos  Encounter Date: 08/21/2015      PT End of Session - 08/21/15 1015    Visit Number 2   Date for PT Re-Evaluation 10/16/15   PT Start Time 0930   PT Stop Time [id number]   PT Time Calculation (min) 45 min   Activity Tolerance Patient tolerated treatment well   Behavior During Therapy [facility] for tasks assessed/performed      Past Medical History:  Diagnosis Date  . Adenocarcinoma of prostate ([place]) 11/11/13   Gleason 7  . Allergy   . Aortic stenosis    Moderate per echo in August of 2011.   . Arthritis    thumbs  . Atrial fibrillation ([place])   . Cancer ([place])   . Coronary artery disease    Minimal per cath in 2010  . Fatigue    Chronic  . GERD (gastroesophageal reflux disease)   . Glaucoma   . Hiatal hernia 2006   EGD   . Hyperlipidemia   . Hypertension   . LBBB (left bundle branch block)    Has dyssynchrony but not felt to be significant enough to warrant resynchronization therapy per [facility].   . PVC's (premature ventricular contractions)   . Retinal detachment   . S/P AV nodal ablation 2008 AND 2009  . Stricture and stenosis of esophagus 2006   EGD  . SVT (supraventricular tachycardia) (HCC)     Past Surgical History:  Procedure Laterality Date  . CARDIAC CATHETERIZATION  01/27/2008   EF 45%; 30% prox LAD, 20 to 30% RCA  . CARDIAC CATHETERIZATION  07/28/2003  . CARDIAC ELECTROPHYSIOLOGY STUDY AND ABLATION    . cataracts     both eyes  . HERNIA REPAIR    . PROSTATE BIOPSY  11/11/13   gleason 7, volume 36.94 cc  . US ECHOCARDIOGRAPHY  08/20/2009   EF 55-60%; Moderate LVH, septal bounce consistent with LBBB, calcified aortic valve with moderate AS/AI    There were no vitals filed  for this visit.      Subjective Assessment - 08/21/15 0932    Subjective "I think its getting better"   Currently in Pain? Yes   Pain Score 3    Pain Location Buttocks   Pain Orientation Distal   Pain Descriptors / Indicators Discomfort            [facility] PT Assessment - 08/21/15 0001      AROM   Overall AROM Comments AROM of the left knee 2-119 degrees flexion, very painful with extension                     [facility] Adult PT Treatment/Exercise - 08/21/15 0001      Exercises   Exercises Knee/Hip     Knee/Hip Exercises: Stretches   Passive Hamstring Stretch Left;10 seconds;5 reps  gentle     Knee/Hip Exercises: Aerobic   Nustep L3 x7 min      Knee/Hip Exercises: Supine   Heel Slides Left;2 sets;10 reps   Straight Leg Raises 1 set;Left;10 reps   Other Supine Knee/Hip Exercises Hip abd/add LLE x10     Knee/Hip Exercises: Sidelying   Hip ABduction Left;2 sets;10 reps     Modalities   Modalities Ultrasound  Ultrasound   Ultrasound Location L ischial Tub   Ultrasound Parameters 1MHz 1.2w/cm2   Ultrasound Goals Pain  healing                  PT Short Term Goals - 08/16/15 1552      PT SHORT TERM GOAL #1   Title independent with intial HEP   Time 2   Period Weeks   Status New           PT Long Term Goals - 08/16/15 1553      PT LONG TERM GOAL #1   Title decrease pain 50%   Time 8   Period Weeks   Status New     PT LONG TERM GOAL #2   Title increase left knee ROM to WNL's   Time 8   Period Weeks   Status New     PT LONG TERM GOAL #3   Title increase left knee strength to 4+/5   Time 8   Period Weeks   Status New     PT LONG TERM GOAL #4   Title walk all distances without difficulty   Time 8   Period Weeks   Status New     PT LONG TERM GOAL #5   Title up and down stairs without difficulty   Time 8   Period Weeks   Status New               Plan - 08/21/15 1015    Clinical Impression Statement Pt  hah increase his L knee AROM but does have pain with ext. Able to tolerated supine interventions well. gentle passive HS stretching causes some pain at end range.    PT Frequency 2x / week   PT Duration 8 weeks   PT Treatment/Interventions ADLs/Self Care Home Management;Cryotherapy;Electrical Stimulation;Iontophoresis 4mg /ml Dexamethasone;Functional mobility training;Stair training;Gait training;Ultrasound;Moist Heat;Therapeutic activities;Therapeutic exercise;Balance training;Patient/family education;Manual techniques   PT Next Visit Plan try to get him moving genlty, continue [place]      Patient will benefit from skilled therapeutic intervention in order to improve the following deficits and impairments:  Abnormal gait, Decreased balance, Decreased mobility, Decreased range of motion, Decreased strength, Difficulty walking, Impaired flexibility, Improper body mechanics, Pain  Visit Diagnosis: Pain in left knee  Stiffness of left knee, not elsewhere classified  Difficulty in walking, not elsewhere classified     Problem List Patient Active Problem List   Diagnosis Date Noted  . Malignant neoplasm of prostate ([place]) 12/14/2013  . HTN (hypertension) 02/24/2011  . CHF (congestive heart failure) ([place]) 02/24/2011  . AS (aortic stenosis) 10/28/2010  . CAD (coronary artery disease) 10/28/2010  . Hyperlipidemia 10/28/2010  . Fatigue 10/28/2010    Scot Jun, PTA  08/21/2015, 10:17 AM  [address] [place], [place], [address] Phone: [phone]   Fax:  [phone]  Name: Jose Petty MRN: [id number] Date of Birth: [birth year]

## 2015-08-23 ENCOUNTER — Ambulatory Visit: Payer: Medicare Other | Admitting: Physical Therapy

## 2015-08-23 DIAGNOSIS — M25562 Pain in left knee: Secondary | ICD-10-CM | POA: Diagnosis not present

## 2015-08-23 DIAGNOSIS — M25662 Stiffness of left knee, not elsewhere classified: Secondary | ICD-10-CM

## 2015-08-23 DIAGNOSIS — R262 Difficulty in walking, not elsewhere classified: Secondary | ICD-10-CM

## 2015-08-23 NOTE — Therapy (Signed)
[place], [place], [address] Phone: [phone]   Fax:  [phone]  Physical Therapy Treatment  Patient Details  Name: Jose Petty MRN: [id number] Date of Birth: [birth year] Referring Provider: Coletta Memos  Encounter Date: 08/23/2015      PT End of Session - 08/23/15 0916    Visit Number 3   Date for PT Re-Evaluation 10/16/15   PT Start Time 0845   PT Stop Time 0930   PT Time Calculation (min) 45 min   Activity Tolerance Patient tolerated treatment well   Behavior During Therapy [facility] for tasks assessed/performed      Past Medical History:  Diagnosis Date  . 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 Allergy   . Aortic stenosis    Moderate per echo in August of 2011.   . Arthritis    thumbs  . Atrial fibrillation ([place])   . Cancer ([place])   . Coronary artery disease    Minimal per cath in 2010  . Fatigue    Chronic  . GERD (gastroesophageal reflux disease)   . Glaucoma   . Hiatal hernia 2006   EGD   . Hyperlipidemia   . Hypertension   . LBBB (left bundle branch block)    Has dyssynchrony but not felt to be significant enough to warrant resynchronization therapy per [facility].   . PVC's (premature ventricular contractions)   . Retinal detachment   . S/P AV nodal ablation 2008 AND 2009  . Stricture and stenosis of esophagus 2006   EGD  . SVT (supraventricular tachycardia) (HCC)     Past Surgical History:  Procedure Laterality Date  . CARDIAC CATHETERIZATION  01/27/2008   EF 45%; 30% prox LAD, 20 to 30% RCA  . CARDIAC CATHETERIZATION  07/28/2003  . CARDIAC ELECTROPHYSIOLOGY STUDY AND ABLATION    . cataracts     both eyes  . HERNIA REPAIR    . PROSTATE BIOPSY  11/11/13   gleason 7, volume 36.94 cc  . US ECHOCARDIOGRAPHY  08/20/2009   EF 55-60%; Moderate LVH, septal bounce consistent with LBBB, calcified aortic valve with moderate AS/AI    There were no vitals filed  for this visit.      Subjective Assessment - 08/23/15 0849    Subjective "Was really sore yeaterday and after PT the other day, woke up at 4am with it hurting this morning."   Limitations Lifting;Standing;Walking;House hold activities   Patient Stated Goals have less pain and walk better   Currently in Pain? Yes   Pain Score 5    Pain Location Buttocks   Pain Orientation Distal   Pain Descriptors / Indicators Sore;Aching                         [facility] Adult PT Treatment/Exercise - 08/23/15 0001      Knee/Hip Exercises: Stretches   Passive Hamstring Stretch Left;3 reps;10 seconds   Piriformis Stretch 3 reps;10 seconds   Piriformis Stretch Limitations Passive   Other Knee/Hip Stretches Hip IR/ER stretch 3 reps, 10 secs     Knee/Hip Exercises: Aerobic   Nustep L3 x7 min     Knee/Hip Exercises: Supine   Heel Slides Left;2 sets;10 reps   Heel Slides Limitations No sliding board, shoes off   Terminal Knee Extension 2 sets;10 reps   Terminal Knee Extension Limitations Pushing into towel   Straight Leg Raises Left;1  set;10 reps   Other Supine Knee/Hip Exercises Hip adduction with ball squeeze 2x10     Knee/Hip Exercises: Sidelying   Hip ABduction 2 sets;10 reps;Left   Other Sidelying Knee/Hip Exercises Hip extension L only, SPTA assist to hold leg, 2x10     Modalities   Modalities Ultrasound     Ultrasound   Ultrasound Location L ischial tub   Ultrasound Parameters 1MHz 1.2w/cm2   Ultrasound Goals Pain  healing                  PT Short Term Goals - 08/16/15 1552      PT SHORT TERM GOAL #1   Title independent with intial HEP   Time 2   Period Weeks   Status New           PT Long Term Goals - 08/16/15 1553      PT LONG TERM GOAL #1   Title decrease pain 50%   Time 8   Period Weeks   Status New     PT LONG TERM GOAL #2   Title increase left knee ROM to WNL's   Time 8   Period Weeks   Status New     PT LONG TERM GOAL #3    Title increase left knee strength to 4+/5   Time 8   Period Weeks   Status New     PT LONG TERM GOAL #4   Title walk all distances without difficulty   Time 8   Period Weeks   Status New     PT LONG TERM GOAL #5   Title up and down stairs without difficulty   Time 8   Period Weeks   Status New               Plan - 08/23/15 [id number]    Clinical Impression Statement Pt had slight increase in pain with hip IR/ER stretch, but was tolerable. Pt tolerated all supine ther ex and side-lying, per subjective report sitting position is really painful still.    Rehab Potential Good   PT Frequency 2x / week   PT Duration 8 weeks   PT Treatment/Interventions ADLs/Self Care Home Management;Cryotherapy;Electrical Stimulation;Iontophoresis 4mg /ml Dexamethasone;Functional mobility training;Stair training;Gait training;Ultrasound;Moist Heat;Therapeutic activities;Therapeutic exercise;Balance training;Patient/family education;Manual techniques   PT Next Visit Plan Continue with supine ther ex and add resistance as tolerated. Check on effectiveness of [place].       Patient will benefit from skilled therapeutic intervention in order to improve the following deficits and impairments:  Abnormal gait, Decreased balance, Decreased mobility, Decreased range of motion, Decreased strength, Difficulty walking, Impaired flexibility, Improper body mechanics, Pain  Visit Diagnosis: Pain in left knee  Stiffness of left knee, not elsewhere classified  Difficulty in walking, not elsewhere classified     Problem List Patient Active Problem List   Diagnosis Date Noted  . Malignant neoplasm of prostate ([place]) 12/14/2013  . HTN (hypertension) 02/24/2011  . CHF (congestive heart failure) ([place]) 02/24/2011  . AS (aortic stenosis) 10/28/2010  . CAD (coronary artery disease) 10/28/2010  . Hyperlipidemia 10/28/2010  . Fatigue 10/28/2010    Dallie Piles, SPTA 08/23/2015, 9:36 AM  [place], [place], [address] Phone: [phone]   Fax:  [phone]  Name: Jose Petty MRN: [id number] Date of Birth: January 09, 1940

## 2015-08-29 ENCOUNTER — Encounter: Payer: Self-pay | Admitting: Physical Therapy

## 2015-08-29 ENCOUNTER — Ambulatory Visit: Payer: Medicare Other | Admitting: Physical Therapy

## 2015-08-29 DIAGNOSIS — R262 Difficulty in walking, not elsewhere classified: Secondary | ICD-10-CM

## 2015-08-29 DIAGNOSIS — M25562 Pain in left knee: Secondary | ICD-10-CM | POA: Diagnosis not present

## 2015-08-29 DIAGNOSIS — M25662 Stiffness of left knee, not elsewhere classified: Secondary | ICD-10-CM

## 2015-08-29 NOTE — Therapy (Signed)
[place], [place], [address] Phone: [phone]   Fax:  [phone]  Physical Therapy Treatment  Patient Details  Name: Jose Petty MRN: [id number] Date of Birth: Petty Referring Provider: Coletta Petty  Encounter Date: 08/29/2015      PT End of Session - 08/29/15 1016    Visit Number 4   Date for PT Re-Evaluation 10/16/15   PT Start Time 0930   PT Stop Time 1016   PT Time Calculation (min) 46 min   Activity Tolerance Patient tolerated treatment well   Behavior During Therapy [facility] for tasks assessed/performed      Past Medical History:  Diagnosis Date  . 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 Allergy   . Aortic stenosis    Moderate per echo in August of 2011.   . Arthritis    thumbs  . Atrial fibrillation ([place])   . Cancer ([place])   . Coronary artery disease    Minimal per cath in 2010  . Fatigue    Chronic  . GERD (gastroesophageal reflux disease)   . Glaucoma   . Hiatal hernia 2006   EGD   . Hyperlipidemia   . Hypertension   . LBBB (left bundle branch block)    Has dyssynchrony but not felt to be significant enough to warrant resynchronization therapy per Jose Petty.   . PVC's (premature ventricular contractions)   . Retinal detachment   . S/P AV nodal ablation 2008 AND 2009  . Stricture and stenosis of esophagus 2006   EGD  . SVT (supraventricular tachycardia) (HCC)     Past Surgical History:  Procedure Laterality Date  . CARDIAC CATHETERIZATION  01/27/2008   EF 45%; 30% prox LAD, 20 to 30% RCA  . CARDIAC CATHETERIZATION  07/28/2003  . CARDIAC ELECTROPHYSIOLOGY STUDY AND ABLATION    . cataracts     both eyes  . HERNIA REPAIR    . PROSTATE BIOPSY  11/11/13   gleason 7, volume 36.94 cc  . US ECHOCARDIOGRAPHY  08/20/2009   EF 55-60%; Moderate LVH, septal bounce consistent with LBBB, calcified aortic valve with moderate AS/AI    There were no vitals filed  for this visit.      Subjective Assessment - 08/29/15 0932    Subjective "Im feeling much better, Im walking better"   Currently in Pain? Yes   Pain Score 2    Pain Location Buttocks   Pain Orientation Proximal            [facility] PT Assessment - 08/29/15 0001      AROM   Overall AROM Comments AROM of the left knee 0-126                     [facility] Adult PT Treatment/Exercise - 08/29/15 0001      Knee/Hip Exercises: Aerobic   Stationary Bike L0 x4 min   Nustep L3 x7 min     Knee/Hip Exercises: Standing   Forward Step Up Left;1 set;10 reps;Hand Hold: 0;Step Height: 4"     Knee/Hip Exercises: Supine   Other Supine Knee/Hip Exercises L hip and knee flexion 2x15     Knee/Hip Exercises: Sidelying   Hip ABduction 2 sets;Left;15 reps     Knee/Hip Exercises: Prone   Hip Extension Left;3 sets;10 reps     Modalities   Modalities Ultrasound     Ultrasound   Ultrasound Location L ischial  tub   Ultrasound Parameters 1MHz 1.2w/cm2   Ultrasound Goals Pain  Healing                   PT Short Term Goals - 08/16/15 1552      PT SHORT TERM GOAL #1   Title independent with intial HEP   Time 2   Period Weeks   Status New           PT Long Term Goals - 08/29/15 1018      PT LONG TERM GOAL #1   Title decrease pain 50%   Status Partially Met     PT LONG TERM GOAL #2   Title increase left knee ROM to WNL's   Status Achieved     PT LONG TERM GOAL #3   Title increase left knee strength to 4+/5   Status On-going     PT LONG TERM GOAL #4   Title walk all distances without difficulty   Status On-going               Plan - 08/29/15 1016    Clinical Impression Statement Pt has progressed and met knee ROM goal. Pt reports that she has been feeling better overall. Progressed to stationary bike and step ups on 4 inch step. Tolerated all exercises well but still has a noticeable limp.    Rehab Potential Good   PT Frequency 2x / week   PT  Duration 8 weeks   PT Treatment/Interventions ADLs/Self Care Home Management;Cryotherapy;Electrical Stimulation;Iontophoresis 40m/ml Dexamethasone;Functional mobility training;Stair training;Gait training;Ultrasound;Moist Heat;Therapeutic activities;Therapeutic exercise;Balance training;Patient/family education;Manual techniques   PT Next Visit Plan Continue with supine ther ex and add resistance as tolerated. Check on effectiveness of [place]       Patient will benefit from skilled therapeutic intervention in order to improve the following deficits and impairments:  Abnormal gait, Decreased balance, Decreased mobility, Decreased range of motion, Decreased strength, Difficulty walking, Impaired flexibility, Improper body mechanics, Pain  Visit Diagnosis: Pain in left knee  Stiffness of left knee, not elsewhere classified  Difficulty in walking, not elsewhere classified     Problem List Patient Active Problem List   Diagnosis Date Noted  . Malignant [place] of [place] ([place] 12/14/2013  . HTN (hypertension) 02/24/2011  . CHF ([place]estive heart failure) ([place] 02/24/2011  . AS (aortic stenosis) 10/28/2010  . CAD (coronary artery disease) 10/28/2010  . Hyperlipidemia 10/28/2010  . Fatigue 10/28/2010    Jose Petty  08/29/2015, 10:19 AM  [address] [address]Phone: [phone]  Fax:  [phone] Name: Jose CZAJKOWSKIMRN: [id number] of Birth: [birth year]

## 2015-09-05 ENCOUNTER — Ambulatory Visit: Payer: Medicare Other | Admitting: Physical Therapy

## 2015-09-05 ENCOUNTER — Encounter: Payer: Self-pay | Admitting: Physical Therapy

## 2015-09-05 DIAGNOSIS — M25562 Pain in left knee: Secondary | ICD-10-CM

## 2015-09-05 DIAGNOSIS — M25662 Stiffness of left knee, not elsewhere classified: Secondary | ICD-10-CM

## 2015-09-05 DIAGNOSIS — R262 Difficulty in walking, not elsewhere classified: Secondary | ICD-10-CM

## 2015-09-05 NOTE — Therapy (Signed)
[place], [place], [address] Phone: [phone]   Fax:  [phone]  Physical Therapy Treatment  Patient Details  Name: Jose Petty MRN: [id number] Date of Birth: 1940/10/18 Referring Provider: Coletta Memos  Encounter Date: 09/05/2015      PT End of Session - 09/05/15 1051    Visit Number 5   Date for PT Re-Evaluation 10/16/15   PT Start Time 5643   PT Stop Time 1056   PT Time Calculation (min) 41 min   Activity Tolerance Patient tolerated treatment well   Behavior During Therapy [facility] for tasks assessed/performed      Past Medical History:  Diagnosis Date  . 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 Allergy   . Aortic stenosis    Moderate per echo in August of 2011.   . Arthritis    thumbs  . Atrial fibrillation ([place])   . Cancer ([place])   . Coronary artery disease    Minimal per cath in 2010  . Fatigue    Chronic  . GERD (gastroesophageal reflux disease)   . Glaucoma   . Hiatal hernia 2006   EGD   . Hyperlipidemia   . Hypertension   . LBBB (left bundle branch block)    Has dyssynchrony but not felt to be significant enough to warrant resynchronization therapy per [facility].   . PVC's (premature ventricular contractions)   . Retinal detachment   . S/P AV nodal ablation 2008 AND 2009  . Stricture and stenosis of esophagus 2006   EGD  . SVT (supraventricular tachycardia) (HCC)     Past Surgical History:  Procedure Laterality Date  . CARDIAC CATHETERIZATION  01/27/2008   EF 45%; 30% prox LAD, 20 to 30% RCA  . CARDIAC CATHETERIZATION  07/28/2003  . CARDIAC ELECTROPHYSIOLOGY STUDY AND ABLATION    . cataracts     both eyes  . HERNIA REPAIR    . PROSTATE BIOPSY  11/11/13   gleason 7, volume 36.94 cc  . US ECHOCARDIOGRAPHY  08/20/2009   EF 55-60%; Moderate LVH, septal bounce consistent with LBBB, calcified aortic valve with moderate AS/AI    There were no vitals filed  for this visit.      Subjective Assessment - 09/05/15 1017    Subjective "Im doing great, still some pain in that one spot"   Currently in Pain? Yes   Pain Score 1    Pain Location Buttocks                         [facility] Adult PT Treatment/Exercise - 09/05/15 0001      Knee/Hip Exercises: Aerobic   Nustep L3 x7 min     Knee/Hip Exercises: Machines for Strengthening   Hip Cybex 20lb 2x10     Knee/Hip Exercises: Standing   Lateral Step Up 2 sets;Left;10 reps;Hand Hold: 0;Step Height: 6"   Forward Step Up Left;1 set;10 reps;Hand Hold: 0;Step Height: 6"     Knee/Hip Exercises: Seated   Sit to Sand 2 sets;without UE support;10 reps  3lb dumbbell      Knee/Hip Exercises: Sidelying   Hip ABduction 2 sets;Left;15 reps     Knee/Hip Exercises: Prone   Hamstring Curl 2 sets;10 reps   Hip Extension Left;3 sets;10 reps                  PT Short Term Goals - 08/16/15  [place] #1   Title independent with intial HEP   Time 2   Period Weeks   Status New           PT Long Term Goals - 08/29/15 1018      PT LONG TERM GOAL #1   Title decrease pain 50%   Status Partially Met     PT LONG TERM GOAL #2   Title increase left knee ROM to WNL's   Status Achieved     PT LONG TERM GOAL #3   Title increase left knee strength to 4+/5   Status On-going     PT LONG TERM GOAL #4   Title walk all distances without difficulty   Status On-going               Plan - 09/05/15 1052    Clinical Impression Statement Pt tolerated additional activities well. Pt reports no increase in pain throughout. Pt reports that she has been feeling better but does have the occasional pain in L buttock at times.   Rehab Potential Good   PT Frequency 2x / week   PT Duration 8 weeks   PT Treatment/Interventions ADLs/Self Care Home Management;Cryotherapy;Electrical Stimulation;Iontophoresis 61m/ml Dexamethasone;Functional mobility training;Stair  training;Gait training;Ultrasound;Moist Heat;Therapeutic activities;Therapeutic exercise;Balance training;Patient/family education;Manual techniques   PT Next Visit Plan Continue with supine ther ex and add resistance as tolerated. Check on effectiveness of [place]       Patient will benefit from skilled therapeutic intervention in order to improve the following deficits and impairments:  Abnormal gait, Decreased balance, Decreased mobility, Decreased range of motion, Decreased strength, Difficulty walking, Impaired flexibility, Improper body mechanics, Pain  Visit Diagnosis: Pain in left knee  Stiffness of left knee, not elsewhere classified  Difficulty in walking, not elsewhere classified     Problem List Patient Active Problem List   Diagnosis Date Noted  . Malignant neoplasm of prostate ([place] 12/14/2013  . HTN (hypertension) 02/24/2011  . CHF (congestive heart failure) ([place] 02/24/2011  . AS (aortic stenosis) 10/28/2010  . CAD (coronary artery disease) 10/28/2010  . Hyperlipidemia 10/28/2010  . Fatigue 10/28/2010    RScot Jun PTA  09/05/2015, 10:58 AM  [place] [address]Phone: [phone]  Fax:  [phone] Name: Jose BRAFFORDMRN: [id number] of Birth: 501-02-1940

## 2015-09-11 ENCOUNTER — Ambulatory Visit: Payer: Medicare Other | Attending: Family Medicine | Admitting: Physical Therapy

## 2015-09-11 ENCOUNTER — Encounter: Payer: Self-pay | Admitting: Physical Therapy

## 2015-09-11 DIAGNOSIS — M25662 Stiffness of left knee, not elsewhere classified: Secondary | ICD-10-CM | POA: Diagnosis not present

## 2015-09-11 DIAGNOSIS — M25562 Pain in left knee: Secondary | ICD-10-CM | POA: Diagnosis present

## 2015-09-11 DIAGNOSIS — R262 Difficulty in walking, not elsewhere classified: Secondary | ICD-10-CM

## 2015-09-11 NOTE — Therapy (Addendum)
[place], [place], [address] Phone: [phone]   Fax:  [phone]  Physical Therapy Treatment  Patient Details  Name: Jose Petty MRN: [id number] Date of Birth: [birth year] Referring Provider: Coletta Memos  Encounter Date: 09/11/2015      PT End of Session - 09/11/15 1224    Visit Number 6   Date for PT Re-Evaluation 10/16/15   PT Start Time 1145   PT Stop Time 1224   PT Time Calculation (min) 39 min   Activity Tolerance Patient tolerated treatment well   Behavior During Therapy [facility] for tasks assessed/performed      Past Medical History:  Diagnosis Date  . 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 Allergy   . Aortic stenosis    Moderate per echo in August of 2011.   . Arthritis    thumbs  . Atrial fibrillation ([place])   . Cancer ([place])   . Coronary artery disease    Minimal per cath in 2010  . Fatigue    Chronic  . GERD (gastroesophageal reflux disease)   . Glaucoma   . Hiatal hernia 2006   EGD   . Hyperlipidemia   . Hypertension   . LBBB (left bundle branch block)    Has dyssynchrony but not felt to be significant enough to warrant resynchronization therapy per [facility].   . PVC's (premature ventricular contractions)   . Retinal detachment   . S/P AV nodal ablation 2008 AND 2009  . Stricture and stenosis of esophagus 2006   EGD  . SVT (supraventricular tachycardia) (HCC)     Past Surgical History:  Procedure Laterality Date  . CARDIAC CATHETERIZATION  01/27/2008   EF 45%; 30% prox LAD, 20 to 30% RCA  . CARDIAC CATHETERIZATION  07/28/2003  . CARDIAC ELECTROPHYSIOLOGY STUDY AND ABLATION    . cataracts     both eyes  . HERNIA REPAIR    . PROSTATE BIOPSY  11/11/13   gleason 7, volume 36.94 cc  . US ECHOCARDIOGRAPHY  08/20/2009   EF 55-60%; Moderate LVH, septal bounce consistent with LBBB, calcified aortic valve with moderate AS/AI    There were no vitals filed  for this visit.      Subjective Assessment - 09/11/15 1145    Subjective "Im wonderful "   Currently in Pain? No/denies   Pain Score 0-No pain                         [facility] Adult PT Treatment/Exercise - 09/11/15 0001      Ambulation/Gait   Stairs Yes   Stairs Assistance 6: Modified independent (Device/Increase time);7: Independent   Stair Management Technique No rails;One rail Right   Number of Stairs 24   Height of Stairs 6   Gait Comments x2      Knee/Hip Exercises: Machines for Strengthening   Cybex Leg Press 20lb 2x15     Knee/Hip Exercises: Standing   Hip Extension Left;Stengthening;2 sets;10 reps;Knee straight   Extension Limitations 5lb   Lateral Step Up Left;10 reps;Hand Hold: 0;Step Height: 6";1 set   Forward Step Up Left;10 reps;Hand Hold: 0;Step Height: 6";2 sets  3lb dumbbells   Other Standing Knee Exercises Heel raises black bar 2x20   Other Standing Knee Exercises LLE HS curls 3lb 3x10     Knee/Hip Exercises: Seated   Hamstring Curl Left;2 sets;15 reps   Hamstring Limitations yellow  tband   Sit to Sand 2 sets;without UE support;10 reps  holding yellow ball                   PT Short Term Goals - 08/16/15 1552      PT SHORT TERM GOAL #1   Title independent with intial HEP   Time 2   Period Weeks   Status New           PT Long Term Goals - 09/11/15 1218      PT LONG TERM GOAL #4   Title walk all distances without difficulty   Status Partially Met     PT LONG TERM GOAL #5   Title up and down stairs without difficulty   Status Achieved               Plan - 09/11/15 1224    Clinical Impression Statement Pt continues to progress towards all goals. Reports decrease pain overall and has progressed to light resisted HS activities. Pt reports that he felt it most from standing hip extensions with LLE.   Rehab Potential Good   PT Frequency 2x / week   PT Duration 8 weeks   PT Treatment/Interventions ADLs/Self  Care Home Management;Cryotherapy;Electrical Stimulation;Iontophoresis 29m/ml Dexamethasone;Functional mobility training;Stair training;Gait training;Ultrasound;Moist Heat;Therapeutic activities;Therapeutic exercise;Balance training;Patient/family education;Manual techniques   PT Next Visit Plan Light HS resistance activities.      Patient will benefit from skilled therapeutic intervention in order to improve the following deficits and impairments:  Abnormal gait, Decreased balance, Decreased mobility, Decreased range of motion, Decreased strength, Difficulty walking, Impaired flexibility, Improper body mechanics, Pain  Visit Diagnosis: Stiffness of left knee, not elsewhere classified  Difficulty in walking, not elsewhere classified  Pain in left knee     Problem List Patient Active Problem List   Diagnosis Date Noted  . Malignant [place] of [place] 12/14/2013  . HTN (hypertension) 02/24/2011  . CHF ([place]estive heart failure) ([place] 02/24/2011  . AS (aortic stenosis) 10/28/2010  . CAD (coronary artery disease) 10/28/2010  . Hyperlipidemia 10/28/2010  . Fatigue 10/28/2010   PHYSICAL THERAPY DISCHARGE SUMMARY  Visits from Start of Care: 6   Plan: Patient agrees to discharge.  Patient goals were partially met. Patient is being discharged due to being pleased with the current functional level.  ?????        RScot Jun PTA  09/11/2015, 12:27 PM  [address] [address]Phone: [phone]  Fax:  [phone] Name: Jose ROBLEROMRN: [id number] of Birth: [birth year]

## 2017-03-09 ENCOUNTER — Ambulatory Visit (HOSPITAL_COMMUNITY): Payer: Medicare Other

## 2017-03-09 ENCOUNTER — Other Ambulatory Visit (HOSPITAL_COMMUNITY): Payer: Self-pay | Admitting: Nurse Practitioner

## 2017-03-09 DIAGNOSIS — M7989 Other specified soft tissue disorders: Principal | ICD-10-CM

## 2017-03-09 DIAGNOSIS — M79604 Pain in right leg: Secondary | ICD-10-CM

## 2017-11-16 ENCOUNTER — Encounter: Payer: Self-pay | Admitting: Nurse Practitioner

## 2017-11-16 ENCOUNTER — Ambulatory Visit (INDEPENDENT_AMBULATORY_CARE_PROVIDER_SITE_OTHER): Payer: Medicare Other | Admitting: Nurse Practitioner

## 2017-11-16 VITALS — BP 140/64 | HR 72 | Ht 66.0 in | Wt 177.4 lb

## 2017-11-16 DIAGNOSIS — K219 Gastro-esophageal reflux disease without esophagitis: Secondary | ICD-10-CM | POA: Diagnosis not present

## 2017-11-16 DIAGNOSIS — R1310 Dysphagia, unspecified: Secondary | ICD-10-CM

## 2017-11-16 NOTE — Patient Instructions (Addendum)
If you are age 76 or older, your body mass index should be between 23-30. Your Body mass index is 28.63 kg/m. If this is out of the aforementioned range listed, please consider follow up with your Primary Care Provider.  If you are age 66 or younger, your body mass index should be between 19-25. Your Body mass index is 28.63 kg/m. If this is out of the aformentioned range listed, please consider follow up with your Primary Care Provider.   Try Pepcid AC 30 minutes before dinner.  (over-the-counter)  Try a wedge pillow.  You have been given GERD Literature.  Follow up with me on December 15, 2017 at 11:30 am.  Thank you for choosing me and [facility].   Tye Savoy, NP

## 2017-11-16 NOTE — Progress Notes (Addendum)
ASSESSMENT    56.  77 year old male with GERD symptoms after stopping Nexium a month ago. Patient concerned about resuming PPI due to recent diagnosis of B12 deficiency.   2.  Colon cancer screening.  He is up-to-date, last one was in 2013.  A 10-year recall was recommended, due to age he may not need to continue with screening colonoscopies   PLAN:    -He certainly has room for some lifestyle modifications and we discussed implementing those.  GERD literature given . He will make every effort to go to bed on an empty stomach, avoid greasy food, continue to limit caffeine to 2 or less drinks a day.  I recommended a wedge pillow.  -Trial of Pepcid AC 20 mg before dinner as his symptoms are worse at nighttime.  -Patient will return to see me in 3 to 4 weeks.  If GERD symptoms not improved within we may need to resume PPI.  -Swallowing issue is a little confusing. Stricture seems unlikely since dysphagia only occurs with first bite of food at each meal. However, if problem persists then will consider barium swallow vrs EGD.    HPI:     Chief Complaint:   GERD and dysphagia  Patient is a 77 year old male known previously to Dr. Sharlett Iles.  He has a history of GERD took Nexium for many years.  In 2006 he had an EGD for evaluation of GERD (greater than 10-year history ). A partial constriction / stenosis of the distal esophagus was found, doesn't appear that dilation was done.  Patient has not had any problems from a GERD standpoint until over the last month.  Patient was feeling tired and run down several weeks ago.  Turns out his B12 was low.  He did some research and found that Nexium could be associated with vitamin B deficiency.  He has been undergoing B12 repletion.  He did stop the Nexium 1 month ago.  Since that time he has had increased problems with belching, regurgitation and burning in his throat.  He does have increased regurgitation at night, does not always go to  bed on an empty stomach.  His head of the bed is not elevated.  Does limit caffeine to 2 or less beverages a day.   A couple of weeks ago patient began having swallowing difficulties, mainly with solids.  His first bite of food with every meal is slow to pass.  Following the first bite however the remainder of meal goes down without any difficulty.  Patient does not feel his throat is unusually dry.  He has not lost any weight due to this and in fact his weight is probably up a few pounds he thinks.  No odynophagia.   Past Medical History:  Diagnosis Date  . 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 Allergy   . Aortic stenosis    Moderate per echo in August of 2011.   . Arthritis    thumbs  . Atrial fibrillation ([place])   . Cancer ([place])   . Coronary artery disease    Minimal per cath in 2010  . Fatigue    Chronic  . GERD (gastroesophageal reflux disease)   . Glaucoma   . Hiatal hernia 2006   EGD   . Hyperlipidemia   . Hypertension   . LBBB (left bundle branch block)    Has dyssynchrony but not  felt to be significant enough to warrant resynchronization therapy per [facility].   . PVC's (premature ventricular contractions)   . Retinal detachment   . S/P AV nodal ablation 2008 AND 2009  . Stricture and stenosis of esophagus 2006   EGD  . SVT (supraventricular tachycardia) (HCC)      Past Surgical History:  Procedure Laterality Date  . CARDIAC CATHETERIZATION  01/27/2008   EF 45%; 30% prox LAD, 20 to 30% RCA  . CARDIAC CATHETERIZATION  07/28/2003  . CARDIAC ELECTROPHYSIOLOGY STUDY AND ABLATION    . cataracts     both eyes  . HERNIA REPAIR    . PROSTATE BIOPSY  11/11/13   gleason 7, volume 36.94 cc  . US ECHOCARDIOGRAPHY  08/20/2009   EF 55-60%; Moderate LVH, septal bounce consistent with LBBB, calcified aortic valve with moderate AS/AI   Family History  Problem Relation Age of Onset  . Heart attack Father   . Heart disease Brother   . Heart attack Brother     . Leukemia Brother   . Colon cancer Neg Hx   . Esophageal cancer Neg Hx   . Stomach cancer Neg Hx   . Rectal cancer Neg Hx   . Alzheimer's disease Mother    Social History   Tobacco Use  . Smoking status: Former Smoker    Last attempt to quit: 01/07/1984    Years since quitting: 33.8  . Smokeless tobacco: Never Used  Substance Use Topics  . Alcohol use: No    Comment: limited  . Drug use: No   Current Outpatient Medications  Medication Sig Dispense Refill  . aspirin 81 MG tablet Take 81 mg by mouth daily.      . carvedilol (COREG) 3.125 MG tablet TAKE 1 TABLET BY MOUTH TWICE DAILY 14 tablet 0  . Coenzyme Q10 (CO Q 10 PO) Take by mouth 2 (two) times daily.    . diazepam (VALIUM) 5 MG tablet Take 5 mg by mouth daily as needed (for vertigo).     Marland Kitchen diltiazem (CARDIZEM CD) 120 MG 24 hr capsule Take 120 mg by mouth.    . esomeprazole (NEXIUM) 40 MG capsule Take 40 mg by mouth daily before breakfast.      . latanoprost (XALATAN) 0.005 % ophthalmic solution Place 1 drop into the left eye at bedtime.     . naproxen sodium (ANAPROX) 220 MG tablet Take 220 mg by mouth 2 (two) times daily as needed (for pain).    . rosuvastatin (CRESTOR) 5 MG tablet Take 5 mg by mouth once a week. No specific day of the week,    . VIAGRA 100 MG tablet Take 100 mg by mouth daily as needed for erectile dysfunction.      No current facility-administered medications for this visit.    Allergies  Allergen Reactions  . Atorvastatin     Muscle aches  . Bactrim [Sulfamethoxazole-Trimethoprim] Other (See Comments)    Fever, gastritis  . Bee Venom   . Cephalexin   . Crestor [Rosuvastatin]     Muscle aches  . Doxycycline Nausea Only  . Quinolones      Review of Systems: All systems reviewed and negative except where noted in HPI.   Creatinine clearance cannot be calculated (Patient's most recent lab result is older than the maximum 21 days allowed.)   Physical Exam:    Wt Readings from Last 3  Encounters:  12/14/13 182 lb 9.6 oz (82.8 kg)  02/25/12 174 lb (78.9  kg)  11/25/11 173 lb (78.5 kg)    There were no vitals taken for this visit. Constitutional:  Pleasant male in no acute distress. Psychiatric: Normal mood and affect. Behavior is normal. EENT: Pupils normal.  Conjunctivae are normal. No scleral icterus. Neck supple.  Cardiovascular: Normal rate, regular rhythm. Murmur present. No edema Pulmonary/chest: Effort normal and breath sounds normal. No wheezing, rales or rhonchi. Abdominal: Soft, nondistended, nontender. Bowel sounds active throughout. There are no masses palpable. No hepatomegaly. Neurological: Alert and oriented to person place and time. Skin: Skin is warm and dry. No rashes noted.  Tye Savoy, NP  11/16/2017, 11:11 AM

## 2017-12-15 ENCOUNTER — Ambulatory Visit: Payer: Medicare Other | Admitting: Nurse Practitioner

## 2017-12-23 ENCOUNTER — Ambulatory Visit (INDEPENDENT_AMBULATORY_CARE_PROVIDER_SITE_OTHER): Payer: Medicare Other | Admitting: Nurse Practitioner

## 2017-12-23 ENCOUNTER — Encounter: Payer: Self-pay | Admitting: Nurse Practitioner

## 2017-12-23 VITALS — BP 130/68 | HR 72 | Ht 66.0 in | Wt 179.0 lb

## 2017-12-23 DIAGNOSIS — K219 Gastro-esophageal reflux disease without esophagitis: Secondary | ICD-10-CM | POA: Diagnosis not present

## 2017-12-23 NOTE — Patient Instructions (Signed)
If you are age 76 or older, your body mass index should be between 23-30. Your Body mass index is 28.89 kg/m. If this is out of the aforementioned range listed, please consider follow up with your Primary Care Provider.  If you are age 78 or younger, your body mass index should be between 19-25. Your Body mass index is 28.89 kg/m. If this is out of the aformentioned range listed, please consider follow up with your Primary Care Provider.   Follow up as needed.   It was a pleasure to see you today!

## 2017-12-23 NOTE — Progress Notes (Signed)
Chief Complaint:   GERD follow-up  IMPRESSION and PLAN:    77 year old male here for follow-up of GERD symptoms after stopping Nexium (for B12 deficiency).  His symptoms have now resolved after a few lifestyle modifications and addition of Pepcid AC 20 mg in the evening.  -Patient will continue current GERD regimen.  He will call us for any recurrent symptoms   HPI:     Patient is a 77 year old male who I saw mid November for evaluation of GERD symptoms after he had stopped on Nexium the month prior.  Patient had stopped the Nexium due to recent diagnosis of B12 deficiency.  He has some room for lifestyle modifications to improve GERD symptoms.  We discussed antireflux measures and I recommended a wedge pillow.  I also recommended a trial of Pepcid before dinner since his symptoms are worse at nighttime.  Patient is here for follow-up.  Mr. Weatherbee is very pleased.  He is no longer having any GERD symptoms.  He did not get a wedge pillow but did start eating dinner earlier in the evening.  He has been taking the Pepcid every evening.  When I saw him in November patient mentioned some mild dysphagia with the first bite of food at each meal and this too has resolved.  He says that his B12 levels have normalized.  He was recently treated for strep throat   Review of systems:     No chest pain, no nausea/ vomiting, no fevers  Past Medical History:  Diagnosis Date  . 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 Allergy   . Aortic stenosis    Moderate per echo in August of 2011.   . Arthritis    thumbs  . Atrial fibrillation ([employer])   . Cancer ([place])   . Coronary artery disease    Minimal per cath in 2010  . Fatigue    Chronic  . GERD (gastroesophageal reflux disease)   . Glaucoma   . Hiatal hernia 2006   EGD   . Hyperlipidemia   . Hypertension   . LBBB (left bundle branch block)    Has dyssynchrony but not felt to be significant enough to warrant resynchronization  therapy per [facility].   . PVC's (premature ventricular contractions)   . Retinal detachment   . S/P AV nodal ablation 2008 AND 2009  . Stricture and stenosis of esophagus 2006   EGD  . SVT (supraventricular tachycardia) (HCC)     Patient's surgical history, family medical history, social history, medications and allergies were all reviewed in Epic   Creatinine clearance cannot be calculated (Patient's most recent lab result is older than the maximum 21 days allowed.)  Current Outpatient Medications  Medication Sig Dispense Refill  . AMBULATORY NON FORMULARY MEDICATION Medication Name:  Turmeric 1000 mg- 1 capsule by mouth twice daily    . AMBULATORY NON FORMULARY MEDICATION Medication Name: Coenzyme P-5-P 50mg  Take 1 tablet daily    . aspirin 81 MG tablet Take 81 mg by mouth daily.      . B-12 METHYLCOBALAMIN 1000 MCG TBDP Take 1 tablet by mouth daily.    . diazepam (VALIUM) 5 MG tablet Take 5 mg by mouth daily as needed (for vertigo).     Marland Kitchen diltiazem (CARDIZEM CD) 180 MG 24 hr capsule Take 180 mg by mouth daily.    . dorzolamide (TRUSOPT) 2 % ophthalmic solution Place 1 drop into both eyes 2 (two)  times daily.    . famotidine (PEPCID) 20 MG tablet Take 20 mg by mouth 2 (two) times daily.    . pravastatin (PRAVACHOL) 20 MG tablet Take 20 mg by mouth daily.    . Probiotic Product (PROBIOTIC-10 ULTIMATE) CAPS Take 1 capsule by mouth daily.     No current facility-administered medications for this visit.     Physical Exam:     BP 130/68   Pulse 72   Ht 5\' 6"  (1.676 m)   Wt 179 lb (81.2 kg)   BMI 28.89 kg/m   GENERAL:  Pleasant male in NAD PSYCH: : Cooperative, normal affect EENT:  conjunctiva pink, mucous membranes moist, neck supple without masses CARDIAC:  RRR, +murmur, no peripheral edema PULM: Normal respiratory effort, lungs CTA bilaterally, no wheezing NEURO: Alert and oriented x 3, no focal neurologic deficits   Tye Savoy , NP 12/23/2017, 11:23 AM

## 2018-08-03 ENCOUNTER — Encounter: Payer: Self-pay | Admitting: *Deleted

## 2018-08-04 ENCOUNTER — Telehealth: Payer: Self-pay | Admitting: *Deleted

## 2018-08-04 NOTE — Telephone Encounter (Signed)
Requested Cd of images of MRI Prostate from 06/25/18, 06/13/16, and 01/19/15 from Dr. Josefina Do, and Joya Gaskins - referral coordinator @ (fax) [phone].

## 2018-08-12 ENCOUNTER — Encounter: Payer: Self-pay | Admitting: *Deleted

## 2018-08-12 ENCOUNTER — Ambulatory Visit
Admission: RE | Admit: 2018-08-12 | Discharge: 2018-08-12 | Disposition: A | Discharge status: Home / Self Care | Payer: Self-pay | Source: Ambulatory Visit | Attending: Radiation Oncology | Admitting: Radiation Oncology

## 2018-08-12 ENCOUNTER — Other Ambulatory Visit: Payer: Self-pay | Admitting: Radiation Oncology

## 2018-08-12 DIAGNOSIS — C61 Malignant neoplasm of prostate: Secondary | ICD-10-CM

## 2018-08-16 NOTE — Progress Notes (Signed)
GU Location of Tumor / Histology: prostatic adenocarcinoma  If Prostate Cancer, Gleason Score is (4 + 3) and PSA is (8.49) at diagnosis. PSA on 06/2018 3.1.    Jose Petty s/p HIFU with a persistent lesion on right side of prostate.  Biopsies of prostate (if applicable) revealed:    Past/Anticipated interventions by urology, if any:    Past/Anticipated interventions by medical oncology, if any: no  Weight changes, if any: no  Bowel/Bladder complaints, if any: IPSS 5. SHIM 13. Reports ED. Reports a weak urine stream due to stricture. Reports incontinence. Denies dysuria or hematuria. Denies urinary leakage, incontinence or post void dribble.   Nausea/Vomiting, if any: no  Pain issues, if any:  Related to arthritis.   SAFETY ISSUES:  Prior radiation? no  Pacemaker/ICD? no  Possible current pregnancy? no, male patient  Is the patient on methotrexate? no  Current Complaints / other details:  78 year old male. Married. Former smoker.   Dr. Josefina Do, MD referred patient to Dr. Tammi Klippel. Dr. Forestine Na recommends IMRT and neoadjuvant ADT.

## 2018-08-17 ENCOUNTER — Telehealth: Payer: Self-pay | Admitting: *Deleted

## 2018-08-17 ENCOUNTER — Ambulatory Visit
Admission: RE | Admit: 2018-08-17 | Discharge: 2018-08-17 | Disposition: A | Discharge status: Home / Self Care | Payer: Medicare Other | Source: Ambulatory Visit | Attending: Radiation Oncology | Admitting: Radiation Oncology

## 2018-08-17 ENCOUNTER — Other Ambulatory Visit: Payer: Self-pay

## 2018-08-17 ENCOUNTER — Encounter: Payer: Self-pay | Admitting: Radiation Oncology

## 2018-08-17 VITALS — Ht 66.0 in | Wt 168.0 lb

## 2018-08-17 DIAGNOSIS — C61 Malignant neoplasm of prostate: Secondary | ICD-10-CM

## 2018-08-17 NOTE — Progress Notes (Signed)
Radiation Oncology         (336) [phone] ________________________________  Initial Outpatient Consultation - Conducted via MyChart Video due to current COVID-19 concerns for limiting patient exposure  Name: Jose Petty MRN: [id number]  Date: 08/17/2018  DOB: [birth year]  CC:Bernerd Limbo, MD  Zenon Mayo, MD   REFERRING PHYSICIAN: Zenon Mayo, MD  DIAGNOSIS: 78 y.o. 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    ICD-10-CM   1. Malignant neoplasm of prostate ([place])  C61     HISTORY OF PRESENT ILLNESS: Jose Petty is a 78 y.o. male with a diagnosis of prostate cancer. He was initially seen in our clinic by Dr. Valere Dross on 12/14/2013 for evaluation of his stage T1c, Gleason 3+4, favorable intermediate-risk prostate cancer. His PSA was 8.49 at time of diagnosis. He ultimately elected to proceed with HIFU treatment in January 2016, under the care of Dr. Forestine Na at [facility] in [place], [place].  He had a repeat HiFU procedure in July 2017 for recurrent disease. He initially had a left hemi-ablation in 2016 and that side has NED except for a tiny 3+3 micro-focus. However, on follow up observation, the right side had an MRI lesion that was treated by hemi-ablation on 07/27/15. This area has remained persistent on most follow up imaging and most recent prostate MRI on 06/25/18 showed interval enlargement of the lesion but no lymphadenopathy or evidence of metastatic disease in the pelvis. Therefore, he proceeded with a MRI-guided fusion biopsy on 07/13/2018 which revealed Gleason 4+3 in the right base. Additionally, Gleason 3+4 was noted in the right mid, right base lateral, and bilateral seminal vesicles which increases his disease stage to T3b, high risk.  Most recent biopsy of prostate revealed:    The patient reviewed these results with his urologist and he has kindly been  referred today for discussion of potential radiation treatment options.  PREVIOUS RADIATION THERAPY: No  PAST MEDICAL HISTORY:  Past Medical History:  Diagnosis Date  . Adenocarcinoma of prostate ([place]) 11/11/13   Gleason 7  . Allergy   . Aortic stenosis    Moderate per echo in August of 2011.   . Arthritis    thumbs  . Atrial fibrillation ([place])   . Cancer ([place])   . Coronary artery disease    Minimal per cath in 2010  . Fatigue    Chronic  . GERD (gastroesophageal reflux disease)   . Glaucoma   . Hiatal hernia 2006   EGD   . Hyperlipidemia   . Hypertension   . LBBB (left bundle branch block)    Has dyssynchrony but not felt to be significant enough to warrant resynchronization therapy per [facility].   . PVC's (premature ventricular contractions)   . Retinal detachment   . S/P AV nodal ablation 2008 AND 2009  . Stricture and stenosis of esophagus 2006   EGD  . SVT (supraventricular tachycardia) ([place])       PAST SURGICAL HISTORY: Past Surgical History:  Procedure Laterality Date  . CARDIAC CATHETERIZATION  01/27/2008   EF 45%; 30% prox LAD, 20 to 30% RCA  . CARDIAC CATHETERIZATION  07/28/2003  . CARDIAC ELECTROPHYSIOLOGY STUDY AND ABLATION    . cataracts     both eyes  . HERNIA REPAIR    . PROSTATE BIOPSY  11/11/13   gleason 7, volume 36.94 cc  . US ECHOCARDIOGRAPHY  08/20/2009   EF 55-60%; Moderate LVH, septal bounce consistent with LBBB, calcified aortic valve with moderate AS/AI    FAMILY HISTORY:  Family History  Problem Relation Age of Onset  . Heart attack Father   . Heart disease Brother   . Heart attack Brother   . Leukemia Brother   . Alzheimer's disease Mother   . Colon cancer Neg Hx   . Esophageal cancer Neg Hx   . Stomach cancer Neg Hx   . Rectal cancer Neg Hx   . Breast cancer Neg Hx   . Prostate cancer Neg Hx     SOCIAL HISTORY:  Social History   Socioeconomic History  . Marital status: Married    Spouse name: Not on file  . Number of  children: 2  . Years of education: Not on file  . Highest education level: Not on file  Occupational History  . 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 Financial resource strain: Not on file  . Food insecurity    Worry: Not on file    Inability: Not on file  . Transportation needs    Medical: Not on file    Non-medical: Not on file  Tobacco Use  . Smoking status: Former Smoker    Packs/day: 0.25    Years: 10.00    Pack years: 2.50    Types: Cigarettes    Quit date: 01/07/1984    Years since quitting: 34.6  . Smokeless tobacco: Never Used  Substance and Sexual Activity  . Alcohol use: No    Comment: limited  . Drug use: No  . Sexual activity: Yes  Lifestyle  . Physical activity    Days per week: Not on file    Minutes per session: Not on file  . Stress: Not on file  Relationships  . Social Herbalist on phone: Not on file    Gets together: Not on file    Attends religious service: Not on file    Active member of club or organization: Not on file    Attends meetings of clubs or organizations: Not on file    Relationship status: Not on file  . Intimate partner violence    Fear of current or ex partner: Not on file    Emotionally abused: Not on file    Physically abused: Not on file    Forced sexual activity: Not on file  Other Topics Concern  . Not on file  Social History Narrative   Daily caffeine    ALLERGIES: Atorvastatin, Bactrim [sulfamethoxazole-trimethoprim], Bee venom, Cephalexin, Cream base, Crestor [rosuvastatin], Doxycycline, and Quinolones  MEDICATIONS:  Current Outpatient Medications  Medication Sig Dispense Refill  . AMBULATORY NON FORMULARY MEDICATION Medication Name:  Turmeric 1000 mg- 1 capsule by mouth twice daily    . AMBULATORY NON FORMULARY MEDICATION Medication Name: Coenzyme P-5-P 50mg  Take 1 tablet daily    . aspirin 81 MG tablet Take 81 mg by mouth daily.      . B-12 METHYLCOBALAMIN 1000 MCG TBDP  Take 1 tablet by mouth daily.    . diazepam (VALIUM) 5 MG tablet Take 5 mg by mouth daily as needed (for vertigo).     Marland Kitchen diltiazem (CARDIZEM CD) 180 MG 24 hr capsule Take 180 mg by mouth daily.    . pravastatin (PRAVACHOL) 20 MG tablet Take 20 mg by mouth daily.    . Probiotic Product (PROBIOTIC-10 ULTIMATE) CAPS Take 1 capsule by mouth  daily.    . sildenafil (VIAGRA) 100 MG tablet TAKE 1/2 TO 1 TABLET BY MOUTH DAILY AS NEEDED    . Travoprost, BAK Free, (TRAVATAN) 0.004 % SOLN ophthalmic solution INSTILL 1 DROP OU QHS     No current facility-administered medications for this encounter.     REVIEW OF SYSTEMS:  On review of systems, the patient reports that he is doing well overall. He denies any chest pain, shortness of breath, cough, fevers, chills, night sweats, or unintended weight changes. He denies any bowel disturbances, and denies abdominal pain, nausea or vomiting. He reports pain related to arthritis, but denies any new musculoskeletal or joint aches or pains. His IPSS was 5, indicating mild urinary symptoms with weak stream due to stricture. He reports incontinence. He denies dysuria or hematuria. His SHIM was 13, indicating he does have mild-moderate erectile dysfunction. A complete review of systems is obtained and is otherwise negative.    PHYSICAL EXAM:  Wt Readings from Last 3 Encounters:  08/17/18 168 lb (76.2 kg)  12/23/17 179 lb (81.2 kg)  11/16/17 177 lb 6.4 oz (80.5 kg)   Temp Readings from Last 3 Encounters:  12/14/13 97.6 F (36.4 C) (Oral)  09/15/11 97.9 F (36.6 C)   BP Readings from Last 3 Encounters:  12/23/17 130/68  11/16/17 140/64  12/14/13 (!) 150/76   Pulse Readings from Last 3 Encounters:  12/23/17 72  11/16/17 72  12/14/13 63   Pain Assessment Pain Score: 0-No pain/10  In general this is a well appearing caucasian male in no acute distress. He's alert and oriented x4 and appropriate throughout the examination. Cardiopulmonary assessment is  negative for acute distress and he exhibits normal effort. Remainder of exam not performed in light of virtual consultation platform.   KPS = 90  100 - Normal; no complaints; no evidence of disease. 90   - Able to carry on normal activity; minor signs or symptoms of disease. 80   - Normal activity with effort; some signs or symptoms of disease. 51   - Cares for self; unable to carry on normal activity or to do active work. 60   - Requires occasional assistance, but is able to care for most of his personal needs. 50   - Requires considerable assistance and frequent medical care. 42   - Disabled; requires special care and assistance. 18   - Severely disabled; hospital admission is indicated although death not imminent. 46   - Very sick; hospital admission necessary; active supportive treatment necessary. 10   - Moribund; fatal processes progressing rapidly. 0     - Dead  Karnofsky DA, Abelmann [place], Craver LS and Burchenal [facility] [phone]) The use of the nitrogen mustards in the palliative treatment of carcinoma: with particular reference to bronchogenic carcinoma Cancer 1 634-56  LABORATORY DATA:  Lab Results  Component Value Date   WBC 10.0 03/19/2012   HGB 15.3 03/19/2012   HCT 41.4 03/19/2012   MCV 90.0 03/19/2012   PLT 201 03/19/2012   Lab Results  Component Value Date   NA 141 03/19/2012   K 3.9 03/19/2012   CL 103 03/19/2012   CO2 27 03/19/2012   Lab Results  Component Value Date   ALT 22 03/19/2012   AST 25 03/19/2012   ALKPHOS 83 03/19/2012   BILITOT 0.6 03/19/2012     RADIOGRAPHY: No results found.    IMPRESSION/PLAN: 1. 78 y.o. gentleman with Stage T3b adenocarcinoma of the prostate with Gleason Score of 4+3, PSA  of 3.1, with bilateral seminal vesicle involvement. We discussed the patient's workup and outlined the nature of prostate cancer in this setting. The patient's T stage, Gleason's score, and PSA put him into the high risk group. Accordingly, he is eligible for  long term androgen deprivation therapy (LT-ADT) in combination with 8 weeks of external radiation to the prostate and pelvic lymph nodes. We discussed the available radiation techniques, and focused on the details and logistics of delivery. We discussed and outlined the risks, benefits, short and long-term effects associated with radiotherapy. We discussed the role of SpaceOAR in reducing the rectal toxicity associated with radiotherapy. We also detailed the role of ADT in the treatment of high risk prostate cancer and outlined the associated side effects that could be expected with this therapy.  At the conclusion of our conversation the patient is interested in moving forward with 8 weeks of external beam therapy in combination with LT-ADT. He has not received his first ADT injection. The patient requested to include [facility], locally in his care team for convenience.  We will make a referral for consult to establish care and start of ADT in the near future.  Pending agreement with his local urologist, we will also coordinate for fiducial marker and SpaceOAR gel placement to reduce rectal toxicity from radiotherapy, prior to CT simulation/treatment planning in anticipation of beginning radiation treatment in mid-October, approximately 8 weeks from start of ADT. We will also discuss the recommendations for bone imaging with bone scan to complete his disease staging prior to treatment.   This encounter was provided by telemedicine platform MyChart Video.  The patient has given verbal consent for this type of encounter and has been advised to only accept a meeting of this type in a secure network environment. The time spent during this encounter was 70 minutes. The attendants for this meeting include Tyler Pita MD, Freeman Caldron PA-C, scribe Clinton Sawyer, patient Jose Petty and his wife, Jose Petty. During the encounter, Tyler Pita MD, Ashlyn Bruning PA-C, and scribe Clinton Sawyer were  located at [facility] Radiation Oncology Department.  Patient Jose Petty and wife were located at home.     Nicholos Johns, PA-C    Tyler Pita, MD  [facility] Direct Dial: [phone]  Fax: [phone] [email or web address]  Skype  [email or web address]  This document serves as a record of services personally performed by Tyler Pita, MD and Freeman Caldron, PA-C. It was created on their behalf by Rae Lips, a trained medical scribe. The creation of this record is based on the scribe's personal observations and the providers' statements to them. This document has been checked and approved by the attending providers.

## 2018-08-17 NOTE — Telephone Encounter (Signed)
CALLED PATIENT TO INFORM OF [place] APPT. WITH DR. HERRICK ON 09-07-18 - ARRIVAL TIME- 10 AM, SPOKE WITH PATIENT AND HE IS AWARE OF THIS APPT.

## 2018-09-07 ENCOUNTER — Telehealth: Payer: Self-pay | Admitting: Medical Oncology

## 2018-09-07 NOTE — Telephone Encounter (Signed)
Spoke with Jose Petty to introduce myself as the prostate nurse navigator and discuss my role. I was unable to meet him 8/11 when he consulted with Dr.Manning. He states the visit went well and he saw Dr. Louis Meckel this morning to establish local urology care. He is scheduled  9/3 to get Eligard.He is ready to get the radiation started as soon as possible but I explained he will not start for about 8 weeks. He originally had HIFU and is disappointed he now needs radiation. I gave him my office number and asked him to call me with questions or concerns. He voiced understanding.

## 2018-09-09 ENCOUNTER — Encounter: Payer: Self-pay | Admitting: Medical Oncology

## 2018-09-09 ENCOUNTER — Encounter: Payer: Self-pay | Admitting: Urology

## 2018-09-09 NOTE — Progress Notes (Signed)
Patient started ADT 09/09/18, therefore, we will proceed with coordination of fiducial marker and SpaceOAR gel placement prior to CT simulation week of Oct 19th in anticipation of beginning radiation treatment week of October 26th.  Nicholos Johns, MMS, PA-C [facility] [place]: [phone]  Fax: [phone]

## 2018-10-11 ENCOUNTER — Telehealth: Payer: Self-pay | Admitting: *Deleted

## 2018-10-11 NOTE — Telephone Encounter (Signed)
CALLED PATIENT TO INFORM OF FID. MARKERS AND SPACE OAR PLACEMENT ON 10-14-18 @ [facility] AND HIS SIM ON 10-20-18 @ 2 PM @ DR. MANNING'S OFFICE, LVM FOR A RETURN CALL

## 2018-10-12 ENCOUNTER — Other Ambulatory Visit: Payer: Self-pay | Admitting: Urology

## 2018-10-12 DIAGNOSIS — C61 Malignant neoplasm of prostate: Secondary | ICD-10-CM

## 2018-10-13 ENCOUNTER — Telehealth: Payer: Self-pay | Admitting: *Deleted

## 2018-10-13 NOTE — Telephone Encounter (Signed)
CALLED PATIENT TO INFORM OF SIM APPT. ON 10-26-18 - ARRIVAL TIME- 7:45 AM @ [place], SPOKE WITH PATIENT AND HE IS AWARE OF THIS APPT.

## 2018-10-15 ENCOUNTER — Encounter: Payer: Self-pay | Admitting: *Deleted

## 2018-10-20 ENCOUNTER — Ambulatory Visit: Payer: Medicare Other | Admitting: Radiation Oncology

## 2018-10-25 ENCOUNTER — Telehealth: Payer: Self-pay | Admitting: *Deleted

## 2018-10-25 NOTE — Telephone Encounter (Signed)
Called patient to inform of sim appt. and MRI appt. For 10-26-18, lvm for a return call

## 2018-10-26 ENCOUNTER — Ambulatory Visit
Admission: RE | Admit: 2018-10-26 | Discharge: 2018-10-26 | Disposition: A | Discharge status: Home / Self Care | Payer: Medicare Other | Source: Ambulatory Visit | Attending: Radiation Oncology | Admitting: Radiation Oncology

## 2018-10-26 ENCOUNTER — Other Ambulatory Visit: Payer: Self-pay

## 2018-10-26 ENCOUNTER — Ambulatory Visit (HOSPITAL_COMMUNITY)
Admission: RE | Admit: 2018-10-26 | Discharge: 2018-10-26 | Disposition: A | Discharge status: Home / Self Care | Payer: Medicare Other | Source: Ambulatory Visit | Attending: Urology | Admitting: Urology

## 2018-10-26 DIAGNOSIS — C61 Malignant neoplasm of prostate: Secondary | ICD-10-CM

## 2018-10-26 DIAGNOSIS — Z510 Encounter for antineoplastic radiation therapy: Secondary | ICD-10-CM | POA: Insufficient documentation

## 2018-10-31 NOTE — Progress Notes (Signed)
  Radiation Oncology         (336) [phone] ________________________________  Name: Jose Petty MRN: [id number]  Date: 10/26/2018  DOB: 1940-05-03  SIMULATION AND TREATMENT PLANNING NOTE    ICD-10-CM   1. Malignant neoplasm of prostate ([place])  C61     DIAGNOSIS:  78 y.o. 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  NARRATIVE:  The patient was brought to the [facility].  Identity was confirmed.  All relevant records and images related to the planned course of therapy were reviewed.  The patient freely provided informed written consent to proceed with treatment after reviewing the details related to the planned course of therapy. The consent form was witnessed and verified by the simulation staff.  Then, the patient was set-up in a stable reproducible supine position for radiation therapy.  A vacuum lock pillow device was custom fabricated to position his legs in a reproducible immobilized position.  Then, I performed a urethrogram under sterile conditions to identify the prostatic apex.  CT images were obtained.  Surface markings were placed.  The CT images were loaded into the planning software.  Then the prostate target and avoidance structures including the rectum, bladder, bowel and hips were contoured.  Treatment planning then occurred.  The radiation prescription was entered and confirmed.  A total of one complex treatment devices were fabricated. I have requested : Intensity Modulated Radiotherapy (IMRT) is medically necessary for this case for the following reason:  Rectal sparing.Marland Kitchen  PLAN:  The patient will receive 45 Gy in 25 fractions of 1.8 Gy, followed by a boost to the prostate to a total dose of 75 Gy with 15 additional fractions of 2.0 Gy.  ________________________________  Sheral Apley Tammi Klippel, M.D.

## 2018-11-03 DIAGNOSIS — Z510 Encounter for antineoplastic radiation therapy: Secondary | ICD-10-CM | POA: Diagnosis not present

## 2018-11-04 ENCOUNTER — Ambulatory Visit
Admission: RE | Admit: 2018-11-04 | Discharge: 2018-11-04 | Disposition: A | Discharge status: Home / Self Care | Payer: Medicare Other | Source: Ambulatory Visit | Attending: Radiation Oncology | Admitting: Radiation Oncology

## 2018-11-04 ENCOUNTER — Other Ambulatory Visit: Payer: Self-pay

## 2018-11-04 DIAGNOSIS — Z510 Encounter for antineoplastic radiation therapy: Secondary | ICD-10-CM | POA: Diagnosis not present

## 2018-11-05 ENCOUNTER — Ambulatory Visit
Admission: RE | Admit: 2018-11-05 | Discharge: 2018-11-05 | Disposition: A | Discharge status: Home / Self Care | Payer: Medicare Other | Source: Ambulatory Visit | Attending: Radiation Oncology | Admitting: Radiation Oncology

## 2018-11-05 ENCOUNTER — Other Ambulatory Visit: Payer: Self-pay

## 2018-11-05 DIAGNOSIS — Z510 Encounter for antineoplastic radiation therapy: Secondary | ICD-10-CM | POA: Diagnosis not present

## 2018-11-08 ENCOUNTER — Other Ambulatory Visit: Payer: Self-pay

## 2018-11-08 ENCOUNTER — Ambulatory Visit
Admission: RE | Admit: 2018-11-08 | Discharge: 2018-11-08 | Disposition: A | Discharge status: Home / Self Care | Payer: Medicare Other | Source: Ambulatory Visit | Attending: Radiation Oncology | Admitting: Radiation Oncology

## 2018-11-08 DIAGNOSIS — C61 Malignant neoplasm of prostate: Secondary | ICD-10-CM | POA: Insufficient documentation

## 2018-11-08 DIAGNOSIS — Z510 Encounter for antineoplastic radiation therapy: Secondary | ICD-10-CM | POA: Diagnosis present

## 2018-11-09 ENCOUNTER — Ambulatory Visit
Admission: RE | Admit: 2018-11-09 | Discharge: 2018-11-09 | Disposition: A | Discharge status: Home / Self Care | Payer: Medicare Other | Source: Ambulatory Visit | Attending: Radiation Oncology | Admitting: Radiation Oncology

## 2018-11-09 ENCOUNTER — Other Ambulatory Visit: Payer: Self-pay

## 2018-11-09 DIAGNOSIS — C61 Malignant neoplasm of prostate: Secondary | ICD-10-CM | POA: Diagnosis not present

## 2018-11-10 ENCOUNTER — Other Ambulatory Visit: Payer: Self-pay

## 2018-11-10 ENCOUNTER — Ambulatory Visit
Admission: RE | Admit: 2018-11-10 | Discharge: 2018-11-10 | Disposition: A | Discharge status: Home / Self Care | Payer: Medicare Other | Source: Ambulatory Visit | Attending: Radiation Oncology | Admitting: Radiation Oncology

## 2018-11-10 DIAGNOSIS — C61 Malignant neoplasm of prostate: Secondary | ICD-10-CM | POA: Diagnosis not present

## 2018-11-11 ENCOUNTER — Other Ambulatory Visit: Payer: Self-pay

## 2018-11-11 ENCOUNTER — Ambulatory Visit
Admission: RE | Admit: 2018-11-11 | Discharge: 2018-11-11 | Disposition: A | Discharge status: Home / Self Care | Payer: Medicare Other | Source: Ambulatory Visit | Attending: Radiation Oncology | Admitting: Radiation Oncology

## 2018-11-11 DIAGNOSIS — C61 Malignant neoplasm of prostate: Secondary | ICD-10-CM | POA: Diagnosis not present

## 2018-11-12 ENCOUNTER — Ambulatory Visit
Admission: RE | Admit: 2018-11-12 | Discharge: 2018-11-12 | Disposition: A | Discharge status: Home / Self Care | Payer: Medicare Other | Source: Ambulatory Visit | Attending: Radiation Oncology | Admitting: Radiation Oncology

## 2018-11-12 ENCOUNTER — Other Ambulatory Visit: Payer: Self-pay

## 2018-11-12 DIAGNOSIS — C61 Malignant neoplasm of prostate: Secondary | ICD-10-CM | POA: Diagnosis not present

## 2018-11-15 ENCOUNTER — Other Ambulatory Visit: Payer: Self-pay

## 2018-11-15 ENCOUNTER — Ambulatory Visit
Admission: RE | Admit: 2018-11-15 | Discharge: 2018-11-15 | Disposition: A | Discharge status: Home / Self Care | Payer: Medicare Other | Source: Ambulatory Visit | Attending: Radiation Oncology | Admitting: Radiation Oncology

## 2018-11-15 DIAGNOSIS — C61 Malignant neoplasm of prostate: Secondary | ICD-10-CM | POA: Diagnosis not present

## 2018-11-16 ENCOUNTER — Other Ambulatory Visit: Payer: Self-pay

## 2018-11-16 ENCOUNTER — Ambulatory Visit
Admission: RE | Admit: 2018-11-16 | Discharge: 2018-11-16 | Disposition: A | Discharge status: Home / Self Care | Payer: Medicare Other | Source: Ambulatory Visit | Attending: Radiation Oncology | Admitting: Radiation Oncology

## 2018-11-16 DIAGNOSIS — C61 Malignant neoplasm of prostate: Secondary | ICD-10-CM | POA: Diagnosis not present

## 2018-11-17 ENCOUNTER — Other Ambulatory Visit: Payer: Self-pay

## 2018-11-17 ENCOUNTER — Ambulatory Visit
Admission: RE | Admit: 2018-11-17 | Discharge: 2018-11-17 | Disposition: A | Discharge status: Home / Self Care | Payer: Medicare Other | Source: Ambulatory Visit | Attending: Radiation Oncology | Admitting: Radiation Oncology

## 2018-11-17 DIAGNOSIS — C61 Malignant neoplasm of prostate: Secondary | ICD-10-CM | POA: Diagnosis not present

## 2018-11-18 ENCOUNTER — Other Ambulatory Visit: Payer: Self-pay

## 2018-11-18 ENCOUNTER — Ambulatory Visit
Admission: RE | Admit: 2018-11-18 | Discharge: 2018-11-18 | Disposition: A | Discharge status: Home / Self Care | Payer: Medicare Other | Source: Ambulatory Visit | Attending: Radiation Oncology | Admitting: Radiation Oncology

## 2018-11-18 DIAGNOSIS — C61 Malignant neoplasm of prostate: Secondary | ICD-10-CM | POA: Diagnosis not present

## 2018-11-19 ENCOUNTER — Ambulatory Visit
Admission: RE | Admit: 2018-11-19 | Discharge: 2018-11-19 | Disposition: A | Discharge status: Home / Self Care | Payer: Medicare Other | Source: Ambulatory Visit | Attending: Radiation Oncology | Admitting: Radiation Oncology

## 2018-11-19 ENCOUNTER — Other Ambulatory Visit: Payer: Self-pay | Admitting: Radiation Oncology

## 2018-11-19 ENCOUNTER — Other Ambulatory Visit: Payer: Self-pay

## 2018-11-19 DIAGNOSIS — C61 Malignant neoplasm of prostate: Secondary | ICD-10-CM

## 2018-11-22 ENCOUNTER — Telehealth: Payer: Self-pay | Admitting: *Deleted

## 2018-11-22 ENCOUNTER — Ambulatory Visit
Admission: RE | Admit: 2018-11-22 | Discharge: 2018-11-22 | Disposition: A | Discharge status: Home / Self Care | Payer: Medicare Other | Source: Ambulatory Visit | Attending: Radiation Oncology | Admitting: Radiation Oncology

## 2018-11-22 ENCOUNTER — Other Ambulatory Visit: Payer: Self-pay

## 2018-11-22 DIAGNOSIS — C61 Malignant neoplasm of prostate: Secondary | ICD-10-CM | POA: Diagnosis not present

## 2018-11-22 NOTE — Telephone Encounter (Signed)
CALLED PATIENT TO INFORM OF STAT LAB ON 11-29-18 @ 1:15 PM @ [place] AND HIS CT- ARRIVAL TIME - 2:15 PM @ [facility] (PT. TO  PICK -UP PREP FROM RADIOLOGY ON 11/26/18), NO RESTRICTIONS TO TEST, AND PATIENT TO ARRIVE @ [facility] FOR BONE SCAN ON 12-03-18 @ 12 PM FOR INJECTION AND PATIENT TO RETURN TO BE SCANNED ON 1127-20 @ 3 PM, LVM FOR A RETURN CALL.

## 2018-11-23 ENCOUNTER — Other Ambulatory Visit: Payer: Self-pay

## 2018-11-23 ENCOUNTER — Ambulatory Visit
Admission: RE | Admit: 2018-11-23 | Discharge: 2018-11-23 | Disposition: A | Discharge status: Home / Self Care | Payer: Medicare Other | Source: Ambulatory Visit | Attending: Radiation Oncology | Admitting: Radiation Oncology

## 2018-11-23 DIAGNOSIS — C61 Malignant neoplasm of prostate: Secondary | ICD-10-CM | POA: Diagnosis not present

## 2018-11-24 ENCOUNTER — Ambulatory Visit
Admission: RE | Admit: 2018-11-24 | Discharge: 2018-11-24 | Disposition: A | Discharge status: Home / Self Care | Payer: Medicare Other | Source: Ambulatory Visit | Attending: Radiation Oncology | Admitting: Radiation Oncology

## 2018-11-24 ENCOUNTER — Other Ambulatory Visit: Payer: Self-pay

## 2018-11-24 DIAGNOSIS — C61 Malignant neoplasm of prostate: Secondary | ICD-10-CM | POA: Diagnosis not present

## 2018-11-25 ENCOUNTER — Other Ambulatory Visit: Payer: Self-pay

## 2018-11-25 ENCOUNTER — Ambulatory Visit
Admission: RE | Admit: 2018-11-25 | Discharge: 2018-11-25 | Disposition: A | Discharge status: Home / Self Care | Payer: Medicare Other | Source: Ambulatory Visit | Attending: Radiation Oncology | Admitting: Radiation Oncology

## 2018-11-25 DIAGNOSIS — C61 Malignant neoplasm of prostate: Secondary | ICD-10-CM | POA: Diagnosis not present

## 2018-11-26 ENCOUNTER — Other Ambulatory Visit: Payer: Self-pay

## 2018-11-26 ENCOUNTER — Ambulatory Visit
Admission: RE | Admit: 2018-11-26 | Discharge: 2018-11-26 | Disposition: A | Discharge status: Home / Self Care | Payer: Medicare Other | Source: Ambulatory Visit | Attending: Radiation Oncology | Admitting: Radiation Oncology

## 2018-11-26 DIAGNOSIS — C61 Malignant neoplasm of prostate: Secondary | ICD-10-CM | POA: Diagnosis not present

## 2018-11-28 ENCOUNTER — Other Ambulatory Visit: Payer: Self-pay

## 2018-11-28 ENCOUNTER — Ambulatory Visit
Admission: RE | Admit: 2018-11-28 | Discharge: 2018-11-28 | Disposition: A | Discharge status: Home / Self Care | Payer: Medicare Other | Source: Ambulatory Visit | Attending: Radiation Oncology | Admitting: Radiation Oncology

## 2018-11-28 DIAGNOSIS — C61 Malignant neoplasm of prostate: Secondary | ICD-10-CM | POA: Diagnosis not present

## 2018-11-29 ENCOUNTER — Ambulatory Visit
Admission: RE | Admit: 2018-11-29 | Discharge: 2018-11-29 | Disposition: A | Discharge status: Home / Self Care | Payer: Medicare Other | Source: Ambulatory Visit | Attending: Radiation Oncology | Admitting: Radiation Oncology

## 2018-11-29 ENCOUNTER — Other Ambulatory Visit: Payer: Self-pay

## 2018-11-29 ENCOUNTER — Encounter (HOSPITAL_COMMUNITY): Payer: Self-pay

## 2018-11-29 ENCOUNTER — Ambulatory Visit (HOSPITAL_COMMUNITY)
Admission: RE | Admit: 2018-11-29 | Discharge: 2018-11-29 | Disposition: A | Discharge status: Home / Self Care | Payer: Medicare Other | Source: Ambulatory Visit | Attending: Radiation Oncology | Admitting: Radiation Oncology

## 2018-11-29 ENCOUNTER — Other Ambulatory Visit: Payer: Self-pay | Admitting: Radiation Oncology

## 2018-11-29 DIAGNOSIS — C61 Malignant neoplasm of prostate: Secondary | ICD-10-CM | POA: Diagnosis not present

## 2018-11-29 LAB — BUN & CREATININE (CHCC)
BUN: 12 mg/dL (ref 8–23)
Creatinine: 0.89 mg/dL (ref 0.61–1.24)
GFR, Est AFR Am: >60 mL/min (ref >60)
GFR, Estimated: >60 mL/min (ref >60)

## 2018-11-29 MED ORDER — IOHEXOL 300 MG/ML  SOLN
100.0000 mL | Freq: Once | INTRAMUSCULAR | Status: AC | PRN
  Administered 2018-11-29: 100 mL via INTRAVENOUS

## 2018-11-29 MED ORDER — SODIUM CHLORIDE (PF) 0.9 % IJ SOLN
INTRAMUSCULAR | Status: AC
  Filled 2018-11-29: qty 50

## 2018-11-30 ENCOUNTER — Ambulatory Visit
Admission: RE | Admit: 2018-11-30 | Discharge: 2018-11-30 | Disposition: A | Discharge status: Home / Self Care | Payer: Medicare Other | Source: Ambulatory Visit | Attending: Radiation Oncology | Admitting: Radiation Oncology

## 2018-11-30 ENCOUNTER — Other Ambulatory Visit: Payer: Self-pay

## 2018-11-30 DIAGNOSIS — C61 Malignant neoplasm of prostate: Secondary | ICD-10-CM | POA: Diagnosis not present

## 2018-12-01 ENCOUNTER — Ambulatory Visit
Admission: RE | Admit: 2018-12-01 | Discharge: 2018-12-01 | Disposition: A | Discharge status: Home / Self Care | Payer: Medicare Other | Source: Ambulatory Visit | Attending: Radiation Oncology | Admitting: Radiation Oncology

## 2018-12-01 ENCOUNTER — Other Ambulatory Visit: Payer: Self-pay

## 2018-12-01 DIAGNOSIS — C61 Malignant neoplasm of prostate: Secondary | ICD-10-CM | POA: Diagnosis not present

## 2018-12-03 ENCOUNTER — Encounter (HOSPITAL_COMMUNITY)
Admission: RE | Admit: 2018-12-03 | Discharge: 2018-12-03 | Disposition: A | Discharge status: Home / Self Care | Payer: Medicare Other | Source: Ambulatory Visit | Attending: Radiation Oncology | Admitting: Radiation Oncology

## 2018-12-03 ENCOUNTER — Other Ambulatory Visit: Payer: Self-pay

## 2018-12-03 DIAGNOSIS — C61 Malignant neoplasm of prostate: Secondary | ICD-10-CM | POA: Diagnosis not present

## 2018-12-03 MED ORDER — TECHNETIUM TC 99M MEDRONATE IV KIT
21.1000 | PACK | Freq: Once | INTRAVENOUS | Status: AC
  Administered 2018-12-03: 12:00:00 21.1 via INTRAVENOUS

## 2018-12-06 ENCOUNTER — Other Ambulatory Visit: Payer: Self-pay

## 2018-12-06 ENCOUNTER — Telehealth: Payer: Self-pay | Admitting: Radiation Oncology

## 2018-12-06 ENCOUNTER — Ambulatory Visit
Admission: RE | Admit: 2018-12-06 | Discharge: 2018-12-06 | Disposition: A | Discharge status: Home / Self Care | Payer: Medicare Other | Source: Ambulatory Visit | Attending: Radiation Oncology | Admitting: Radiation Oncology

## 2018-12-06 DIAGNOSIS — C61 Malignant neoplasm of prostate: Secondary | ICD-10-CM | POA: Diagnosis not present

## 2018-12-06 NOTE — Telephone Encounter (Signed)
-----   Message from Tyler Pita, MD sent at 12/06/2018  1:21 PM EST ----- Regarding: RE: Bone scan I reviewed and sent MyChart msg to patient.  If you can call to follow up, that'd be great ----- Message ----- From: Heywood Footman, RN Sent: 12/06/2018   1:09 PM EST To: Tyler Pita, MD Subject: Bone scan                                      Undertreat prostate patient. Bone scan done 12/03/2018 proved to be NEGATIVE for metastatic disease. Just bird dogging. If I can do anything further let me know.   Sam

## 2018-12-06 NOTE — Telephone Encounter (Signed)
Phoned patient as requested by Dr. Tammi Klippel. Explained his bone scan was negative for metastatic disease. Patient verbalized understanding. Patient confirms seeing message from Dr. Tammi Klippel on Sunday in [place]. Patient did inquire if further ADT is recommended. Patient received Eligard 45 approximately 8 weeks prior to the start of radiation therapy. Patient understands this RN will consult with Dr. Tammi Klippel and phone back.

## 2018-12-07 ENCOUNTER — Ambulatory Visit
Admission: RE | Admit: 2018-12-07 | Discharge: 2018-12-07 | Disposition: A | Discharge status: Home / Self Care | Payer: Medicare Other | Source: Ambulatory Visit | Attending: Radiation Oncology | Admitting: Radiation Oncology

## 2018-12-07 ENCOUNTER — Encounter: Payer: Self-pay | Admitting: Medical Oncology

## 2018-12-07 ENCOUNTER — Other Ambulatory Visit: Payer: Self-pay

## 2018-12-07 DIAGNOSIS — C61 Malignant neoplasm of prostate: Secondary | ICD-10-CM | POA: Diagnosis present

## 2018-12-07 DIAGNOSIS — Z510 Encounter for antineoplastic radiation therapy: Secondary | ICD-10-CM | POA: Insufficient documentation

## 2018-12-07 NOTE — Progress Notes (Signed)
Patient had questioned when his next ADT injection would be scheduled. I informed him he received a 6 month injection 9/3, so he should be scheduled in March.  He states he is tolerating radiation well burt has had some GI side effects. We discussed diet and how to manage. He voiced understanding.

## 2018-12-08 ENCOUNTER — Ambulatory Visit
Admission: RE | Admit: 2018-12-08 | Discharge: 2018-12-08 | Disposition: A | Discharge status: Home / Self Care | Payer: Medicare Other | Source: Ambulatory Visit | Attending: Radiation Oncology | Admitting: Radiation Oncology

## 2018-12-08 ENCOUNTER — Other Ambulatory Visit: Payer: Self-pay

## 2018-12-08 DIAGNOSIS — C61 Malignant neoplasm of prostate: Secondary | ICD-10-CM | POA: Diagnosis not present

## 2018-12-09 ENCOUNTER — Ambulatory Visit
Admission: RE | Admit: 2018-12-09 | Discharge: 2018-12-09 | Disposition: A | Discharge status: Home / Self Care | Payer: Medicare Other | Source: Ambulatory Visit | Attending: Radiation Oncology | Admitting: Radiation Oncology

## 2018-12-09 ENCOUNTER — Other Ambulatory Visit: Payer: Self-pay

## 2018-12-09 DIAGNOSIS — C61 Malignant neoplasm of prostate: Secondary | ICD-10-CM | POA: Diagnosis not present

## 2018-12-10 ENCOUNTER — Ambulatory Visit: Payer: Medicare Other

## 2018-12-10 ENCOUNTER — Ambulatory Visit
Admission: RE | Admit: 2018-12-10 | Discharge: 2018-12-10 | Disposition: A | Discharge status: Home / Self Care | Payer: Medicare Other | Source: Ambulatory Visit | Attending: Radiation Oncology | Admitting: Radiation Oncology

## 2018-12-10 ENCOUNTER — Other Ambulatory Visit: Payer: Self-pay

## 2018-12-10 DIAGNOSIS — C61 Malignant neoplasm of prostate: Secondary | ICD-10-CM | POA: Diagnosis not present

## 2018-12-13 ENCOUNTER — Ambulatory Visit: Payer: Medicare Other

## 2018-12-13 ENCOUNTER — Ambulatory Visit
Admission: RE | Admit: 2018-12-13 | Discharge: 2018-12-13 | Disposition: A | Discharge status: Home / Self Care | Payer: Medicare Other | Source: Ambulatory Visit | Attending: Radiation Oncology | Admitting: Radiation Oncology

## 2018-12-13 ENCOUNTER — Other Ambulatory Visit: Payer: Self-pay

## 2018-12-13 DIAGNOSIS — C61 Malignant neoplasm of prostate: Secondary | ICD-10-CM | POA: Diagnosis not present

## 2018-12-14 ENCOUNTER — Other Ambulatory Visit: Payer: Self-pay

## 2018-12-14 ENCOUNTER — Ambulatory Visit
Admission: RE | Admit: 2018-12-14 | Discharge: 2018-12-14 | Disposition: A | Discharge status: Home / Self Care | Payer: Medicare Other | Source: Ambulatory Visit | Attending: Radiation Oncology | Admitting: Radiation Oncology

## 2018-12-14 DIAGNOSIS — C61 Malignant neoplasm of prostate: Secondary | ICD-10-CM | POA: Diagnosis not present

## 2018-12-15 ENCOUNTER — Other Ambulatory Visit: Payer: Self-pay

## 2018-12-15 ENCOUNTER — Ambulatory Visit
Admission: RE | Admit: 2018-12-15 | Discharge: 2018-12-15 | Disposition: A | Discharge status: Home / Self Care | Payer: Medicare Other | Source: Ambulatory Visit | Attending: Radiation Oncology | Admitting: Radiation Oncology

## 2018-12-15 DIAGNOSIS — C61 Malignant neoplasm of prostate: Secondary | ICD-10-CM | POA: Diagnosis not present

## 2018-12-16 ENCOUNTER — Ambulatory Visit
Admission: RE | Admit: 2018-12-16 | Discharge: 2018-12-16 | Disposition: A | Discharge status: Home / Self Care | Payer: Medicare Other | Source: Ambulatory Visit | Attending: Radiation Oncology | Admitting: Radiation Oncology

## 2018-12-16 ENCOUNTER — Other Ambulatory Visit: Payer: Self-pay

## 2018-12-16 DIAGNOSIS — C61 Malignant neoplasm of prostate: Secondary | ICD-10-CM | POA: Diagnosis not present

## 2018-12-17 ENCOUNTER — Other Ambulatory Visit: Payer: Self-pay

## 2018-12-17 ENCOUNTER — Ambulatory Visit
Admission: RE | Admit: 2018-12-17 | Discharge: 2018-12-17 | Disposition: A | Discharge status: Home / Self Care | Payer: Medicare Other | Source: Ambulatory Visit | Attending: Radiation Oncology | Admitting: Radiation Oncology

## 2018-12-17 DIAGNOSIS — C61 Malignant neoplasm of prostate: Secondary | ICD-10-CM | POA: Diagnosis not present

## 2018-12-20 ENCOUNTER — Other Ambulatory Visit: Payer: Self-pay

## 2018-12-20 ENCOUNTER — Ambulatory Visit
Admission: RE | Admit: 2018-12-20 | Discharge: 2018-12-20 | Disposition: A | Discharge status: Home / Self Care | Payer: Medicare Other | Source: Ambulatory Visit | Attending: Radiation Oncology | Admitting: Radiation Oncology

## 2018-12-20 DIAGNOSIS — C61 Malignant neoplasm of prostate: Secondary | ICD-10-CM | POA: Diagnosis not present

## 2018-12-21 ENCOUNTER — Other Ambulatory Visit: Payer: Self-pay

## 2018-12-21 ENCOUNTER — Ambulatory Visit
Admission: RE | Admit: 2018-12-21 | Discharge: 2018-12-21 | Disposition: A | Discharge status: Home / Self Care | Payer: Medicare Other | Source: Ambulatory Visit | Attending: Radiation Oncology | Admitting: Radiation Oncology

## 2018-12-21 DIAGNOSIS — C61 Malignant neoplasm of prostate: Secondary | ICD-10-CM | POA: Diagnosis not present

## 2018-12-22 ENCOUNTER — Other Ambulatory Visit: Payer: Self-pay

## 2018-12-22 ENCOUNTER — Ambulatory Visit
Admission: RE | Admit: 2018-12-22 | Discharge: 2018-12-22 | Disposition: A | Discharge status: Home / Self Care | Payer: Medicare Other | Source: Ambulatory Visit | Attending: Radiation Oncology | Admitting: Radiation Oncology

## 2018-12-22 DIAGNOSIS — C61 Malignant neoplasm of prostate: Secondary | ICD-10-CM | POA: Diagnosis not present

## 2018-12-23 ENCOUNTER — Ambulatory Visit
Admission: RE | Admit: 2018-12-23 | Discharge: 2018-12-23 | Disposition: A | Discharge status: Home / Self Care | Payer: Medicare Other | Source: Ambulatory Visit | Attending: Radiation Oncology | Admitting: Radiation Oncology

## 2018-12-23 ENCOUNTER — Other Ambulatory Visit: Payer: Self-pay

## 2018-12-23 DIAGNOSIS — C61 Malignant neoplasm of prostate: Secondary | ICD-10-CM | POA: Diagnosis not present

## 2018-12-24 ENCOUNTER — Ambulatory Visit
Admission: RE | Admit: 2018-12-24 | Discharge: 2018-12-24 | Disposition: A | Discharge status: Home / Self Care | Payer: Medicare Other | Source: Ambulatory Visit | Attending: Radiation Oncology | Admitting: Radiation Oncology

## 2018-12-24 ENCOUNTER — Other Ambulatory Visit: Payer: Self-pay

## 2018-12-24 DIAGNOSIS — C61 Malignant neoplasm of prostate: Secondary | ICD-10-CM | POA: Diagnosis not present

## 2018-12-27 ENCOUNTER — Ambulatory Visit
Admission: RE | Admit: 2018-12-27 | Discharge: 2018-12-27 | Disposition: A | Discharge status: Home / Self Care | Payer: Medicare Other | Source: Ambulatory Visit | Attending: Radiation Oncology | Admitting: Radiation Oncology

## 2018-12-27 ENCOUNTER — Other Ambulatory Visit: Payer: Self-pay

## 2018-12-27 DIAGNOSIS — C61 Malignant neoplasm of prostate: Secondary | ICD-10-CM | POA: Diagnosis not present

## 2018-12-28 ENCOUNTER — Other Ambulatory Visit: Payer: Self-pay

## 2018-12-28 ENCOUNTER — Ambulatory Visit
Admission: RE | Admit: 2018-12-28 | Discharge: 2018-12-28 | Disposition: A | Discharge status: Home / Self Care | Payer: Medicare Other | Source: Ambulatory Visit | Attending: Radiation Oncology | Admitting: Radiation Oncology

## 2018-12-28 DIAGNOSIS — C61 Malignant neoplasm of prostate: Secondary | ICD-10-CM | POA: Diagnosis not present

## 2018-12-29 ENCOUNTER — Other Ambulatory Visit: Payer: Self-pay

## 2018-12-29 ENCOUNTER — Ambulatory Visit
Admission: RE | Admit: 2018-12-29 | Discharge: 2018-12-29 | Disposition: A | Discharge status: Home / Self Care | Payer: Medicare Other | Source: Ambulatory Visit | Attending: Radiation Oncology | Admitting: Radiation Oncology

## 2018-12-29 DIAGNOSIS — C61 Malignant neoplasm of prostate: Secondary | ICD-10-CM | POA: Diagnosis not present

## 2018-12-30 ENCOUNTER — Encounter: Payer: Self-pay | Admitting: Medical Oncology

## 2018-12-30 ENCOUNTER — Ambulatory Visit
Admission: RE | Admit: 2018-12-30 | Discharge: 2018-12-30 | Disposition: A | Discharge status: Home / Self Care | Payer: Medicare Other | Source: Ambulatory Visit | Attending: Radiation Oncology | Admitting: Radiation Oncology

## 2018-12-30 ENCOUNTER — Encounter: Payer: Self-pay | Admitting: Radiation Oncology

## 2018-12-30 ENCOUNTER — Ambulatory Visit: Payer: Medicare Other

## 2018-12-30 ENCOUNTER — Other Ambulatory Visit: Payer: Self-pay

## 2018-12-30 DIAGNOSIS — C61 Malignant neoplasm of prostate: Secondary | ICD-10-CM | POA: Diagnosis not present

## 2019-01-01 NOTE — Progress Notes (Signed)
  Radiation Oncology         (336) [phone] ________________________________  Name: Jose Petty MRN: [id number]  Date: 12/30/2018  DOB: 1940/04/05  End of Treatment Note  Diagnosis:   78 y.o. 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 The prostate, seminal vesicles, and pelvic lymph nodes were initially treated to 45 Gy in 25 fractions of 1.8 Gy  2. The prostate only was boosted to 75 Gy with 15 additional fractions of 2.0 Gy   Beams/energy:  1. The prostate, seminal vesicles, and pelvic lymph nodes were initially treated using VMAT intensity modulated radiotherapy delivering 6 megavolt photons. Image guidance was performed with CB-CT studies prior to each fraction. He was immobilized with a body fix lower extremity mold.  2. the prostate only was boosted using VMAT intensity modulated radiotherapy delivering 6 megavolt photons. Image guidance was performed with CB-CT studies prior to each fraction. He was immobilized with a body fix lower extremity mold.  Narrative: The patient tolerated radiation treatment relatively well.   The patient experienced some minor urinary irritation and modest fatigue.    Plan: The patient has completed radiation treatment. He will return to radiation oncology clinic for routine followup in one month. I advised him to call or return sooner if he has any questions or concerns related to his recovery or treatment. ________________________________  Sheral Apley. Tammi Klippel, M.D.

## 2019-01-03 ENCOUNTER — Ambulatory Visit: Payer: Medicare Other

## 2019-02-03 ENCOUNTER — Other Ambulatory Visit: Payer: Self-pay

## 2019-02-03 ENCOUNTER — Ambulatory Visit
Admission: RE | Admit: 2019-02-03 | Discharge: 2019-02-03 | Disposition: A | Discharge status: Home / Self Care | Payer: Medicare Other | Source: Ambulatory Visit | Attending: Urology | Admitting: Urology

## 2019-02-03 ENCOUNTER — Encounter: Payer: Self-pay | Admitting: Urology

## 2019-02-03 DIAGNOSIS — C61 Malignant neoplasm of prostate: Secondary | ICD-10-CM

## 2019-02-03 NOTE — Progress Notes (Signed)
Radiation Oncology         [phone]) [phone] ________________________________  Name: Jose Petty MRN: [id number]  Date: 02/03/2019  DOB: [birth year]  Post Treatment Note  CC: Jose Harvey, NP  Carolan Clines, MD  Diagnosis:   79 y.o. 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 The prostate, seminal vesicles, and pelvic lymph nodes were initially treated to 45 Gy in 25 fractions of 1.8 Gy  2. The prostate only was boosted to 75 Gy with 15 additional fractions of 2.0 Gy    Narrative:  I spoke with the patient to conduct his routine scheduled 1 month follow up visit via telephone to spare the patient unnecessary potential exposure in the healthcare setting during the current COVID-19 pandemic.  The patient was notified in advance and gave permission to proceed with this visit format.  He tolerated radiation treatment relatively well.   The patient experienced some minor urinary irritation and modest fatigue.                                On review of systems, the patient states that he is doing well in general.  He reports developing significant LUTS approximately 1 to 2 weeks after completion of his radiation treatments and was started on Flomax by his primary care provider which has resulted in significant improvement in his flow of stream and ability to empty his bladder.  He continues with some increased frequency/urgency as well as hesitancy, intermittency and weakened flow of stream but in general, feels that he empties his bladder well when voiding.  He reports nocturia 2-3 times per night which has improved since completion of radiation.  He specifically denies dysuria, gross hematuria, suprapubic pain, fever, chills or night sweats.  He reports intermittent constipation but denies  abdominal pain, nausea, vomiting or diarrhea.  He reports a healthy appetite and is maintaining his weight.  He continues with mild to modest fatigue but otherwise feels that he is tolerating the ADT well.  ALLERGIES:  is allergic to atorvastatin; bactrim [sulfamethoxazole-trimethoprim]; bee venom; cephalexin; cream base; crestor [rosuvastatin]; doxycycline; and quinolones.  Meds: Current Outpatient Medications  Medication Sig Dispense Refill  . AMBULATORY NON FORMULARY MEDICATION Medication Name:  Turmeric 1000 mg- 1 capsule by mouth twice daily    . AMBULATORY NON FORMULARY MEDICATION Medication Name: Coenzyme P-5-P 50mg  Take 1 tablet daily    . aspirin 81 MG tablet Take 81 mg by mouth daily.      . B-12 METHYLCOBALAMIN 1000 MCG TBDP Take 1 tablet by mouth daily.    . diazepam (VALIUM) 5 MG tablet Take 5 mg by mouth daily as needed (for vertigo).     Marland Kitchen diltiazem (CARDIZEM CD) 180 MG 24 hr capsule Take 180 mg by mouth daily.    . pravastatin (PRAVACHOL) 20 MG tablet Take 20 mg by mouth daily.    . Probiotic Product (PROBIOTIC-10 ULTIMATE) CAPS Take 1 capsule by mouth daily.    . sildenafil (VIAGRA) 100 MG tablet TAKE 1/2 TO 1 TABLET BY MOUTH DAILY AS NEEDED    . Travoprost, BAK Free, (TRAVATAN) 0.004 % SOLN ophthalmic solution INSTILL 1 DROP OU QHS    . diazepam (VALIUM) 5 MG tablet Take by mouth.    Marland Kitchen  esomeprazole (NEXIUM) 40 MG capsule Take by mouth.    . pravastatin (PRAVACHOL) 20 MG tablet TAKE 1 TABLET BY MOUTH EVERY DAY     No current facility-administered medications for this encounter.    Physical Findings:  vitals were not taken for this visit.   /Unable to assess due to telephone follow up visit format.  Lab Findings: Lab Results  Component Value Date   WBC 10.0 03/19/2012   HGB 15.3 03/19/2012   HCT 41.4 03/19/2012   MCV 90.0 03/19/2012   PLT 201 03/19/2012     Radiographic Findings: No results found.  Impression/Plan: 1. 79 y.o. gentleman with Stage T3b  adenocarcinoma of the prostate with Gleason score of 4+3, PSA of 3.1, and seminal vesicle involvement s/p HiFU x2. He will continue to follow up with urology for ongoing PSA determinations and has an appointment scheduled with Dr. Louis Meckel in 02/2019.  He is planning to continue taking the Flomax daily until his follow-up visit.  He understands what to expect with regards to PSA monitoring going forward. I will look forward to following his response to treatment via correspondence with urology, and would be happy to continue to participate in his care if clinically indicated. I talked to the patient about what to expect in the future, including his risk for erectile dysfunction and rectal bleeding. I encouraged him to call or return to the office if he has any questions regarding his previous radiation or possible radiation side effects. He was comfortable with this plan and will follow up as needed.    Nicholos Johns, PA-C

## 2019-02-15 ENCOUNTER — Ambulatory Visit: Payer: Medicare Other

## 2019-02-17 ENCOUNTER — Ambulatory Visit: Payer: Medicare Other

## 2019-06-02 ENCOUNTER — Encounter: Payer: Self-pay | Admitting: Medical Oncology

## 2019-06-02 NOTE — Progress Notes (Signed)
Patient called stating he is having pain in his right femur and hip. He completed radiation 12/24. He recently took a trip and had difficulty walking. His last  PSA on 3/8,  was .54. He is worried about bone mets. We discussed his bone scan and CT scan from 11/20 were negative. He did see his primary care and they suggested  x-rays but he wanted to check with our office first. He does not recall and type of injury or doing anything to cause this pain. I encouraged him to have his primary order the x-rays to further evaluate. He has follow up PSA with Dr. Louis Meckel 6/7.

## 2019-08-25 ENCOUNTER — Other Ambulatory Visit: Payer: Self-pay | Admitting: Orthopedic Surgery

## 2019-08-25 DIAGNOSIS — M545 Low back pain, unspecified: Secondary | ICD-10-CM

## 2019-08-31 ENCOUNTER — Ambulatory Visit
Admission: RE | Admit: 2019-08-31 | Discharge: 2019-08-31 | Disposition: A | Discharge status: Home / Self Care | Payer: Medicare Other | Source: Ambulatory Visit | Attending: Orthopedic Surgery | Admitting: Orthopedic Surgery

## 2019-08-31 ENCOUNTER — Other Ambulatory Visit: Payer: Self-pay

## 2019-08-31 DIAGNOSIS — M545 Low back pain, unspecified: Secondary | ICD-10-CM

## 2019-10-06 ENCOUNTER — Ambulatory Visit: Payer: Medicare Other | Attending: Physical Medicine and Rehabilitation | Admitting: Physical Therapy

## 2019-10-06 ENCOUNTER — Encounter: Payer: Self-pay | Admitting: Physical Therapy

## 2019-10-06 ENCOUNTER — Other Ambulatory Visit: Payer: Self-pay

## 2019-10-06 DIAGNOSIS — M5441 Lumbago with sciatica, right side: Secondary | ICD-10-CM | POA: Insufficient documentation

## 2019-10-06 DIAGNOSIS — R262 Difficulty in walking, not elsewhere classified: Secondary | ICD-10-CM | POA: Diagnosis present

## 2019-10-06 NOTE — Therapy (Signed)
[place]. [place], [place], [address] Phone: [phone]   Fax:  [phone]  Physical Therapy Evaluation  Patient Details  Name: Jose Petty MRN: [id number] Date of Birth: [birth year] Referring Provider (PT): Nelva Bush   Encounter Date: 10/06/2019   PT End of Session - 10/06/19 1653    Visit Number 1    Date for PT Re-Evaluation 12/06/19    PT Start Time 1604    PT Stop Time 1655    PT Time Calculation (min) 51 min    Activity Tolerance Patient tolerated treatment well    Behavior During Therapy [facility] for tasks assessed/performed           Past Medical History:  Diagnosis Date  . Adenocarcinoma of prostate ([place]) 11/11/13   Gleason 7  . Allergy   . Aortic stenosis    Moderate per echo in August of 2011.   . Arthritis    thumbs  . Atrial fibrillation ([place])   . Cancer ([place])   . Coronary artery disease    Minimal per cath in 2010  . Fatigue    Chronic  . GERD (gastroesophageal reflux disease)   . Glaucoma   . Hiatal hernia 2006   EGD   . Hyperlipidemia   . Hypertension   . LBBB (left bundle branch block)    Has dyssynchrony but not felt to be significant enough to warrant resynchronization therapy per [facility].   . PVC's (premature ventricular contractions)   . Retinal detachment   . S/P AV nodal ablation 2008 AND 2009  . Stricture and stenosis of esophagus 2006   EGD  . SVT (supraventricular tachycardia) (HCC)     Past Surgical History:  Procedure Laterality Date  . CARDIAC CATHETERIZATION  01/27/2008   EF 45%; 30% prox LAD, 20 to 30% RCA  . CARDIAC CATHETERIZATION  07/28/2003  . CARDIAC ELECTROPHYSIOLOGY STUDY AND ABLATION    . cataracts     both eyes  . HERNIA REPAIR    . PROSTATE BIOPSY  11/11/13   gleason 7, volume 36.94 cc  . US ECHOCARDIOGRAPHY  08/20/2009   EF 55-60%; Moderate LVH, septal bounce consistent with LBBB, calcified aortic valve with moderate AS/AI    There were no vitals  filed for this visit.    Subjective Assessment - 10/06/19 1610    Subjective Patient reports that back in May he started having right buttock and hip pain.  Reports that the pain is worse with walking, a cortisone injection did not help. X-rays showed that the hip was negative.  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    Limitations Standing;Walking;House hold activities    How long can you walk comfortably? 5 minutes    Patient Stated Goals have less pain    Currently in Pain? Yes    Pain Score 2     Pain Location Hip    Pain Orientation Right;Posterior    Pain Descriptors / Indicators Aching    Pain Type Acute pain    Pain Radiating Towards denies    Pain Onset More than a month ago    Pain Frequency Constant    Aggravating Factors  walking the pain will go up to 8/10    Pain Relieving Factors rest pain at best a 2/10    Effect of Pain on Daily Activities difficulty with any walking              [facility] PT  Assessment - 10/06/19 0001      Assessment   Medical Diagnosis LBP, stenosis, DDD    Referring Provider (PT) Ramos    Onset Date/Surgical Date 09/05/19      Precautions   Precautions None      Balance Screen   Has the patient fallen in the past 6 months No    Has the patient had a decrease in activity level because of a fear of falling?  No    Is the patient reluctant to leave their home because of a fear of falling?  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      Posture/Postural Control   Posture Comments fwd head, rounded shoulders,       ROM / Strength   AROM / PROM / Strength AROM;Strength      AROM   Overall AROM Comments Lumbar ROM Decreased 25% with mild pain      Strength   Overall Strength Comments 4/5 with  mild pain in the right buttock      Flexibility   Soft Tissue Assessment /Muscle Length yes    Hamstrings tight with pain in the right    Piriformis pain and tight right piriformis      Palpation   Palpation comment very tender in the right buttock      Ambulation/Gait   Gait Comments mild right toe out gait, reports that he has difficulty going up stairs step over step                      Objective measurements completed on examination: See above findings.                 PT Short Term Goals - 10/06/19 1659      PT SHORT TERM GOAL #1   Title independent with intial HEP    Time 2    Period Weeks    Status New             PT Long Term Goals - 10/06/19 1659      PT LONG TERM GOAL #1   Title decrease pain 50%    Time 8    Period Weeks    Status New      PT LONG TERM GOAL #2   Title incresae lumbar ROM to WFL's    Time 8    Period Weeks    Status New      PT LONG TERM GOAL #3   Title be able to go up and down stairs step over step    Time 8    Period Weeks    Status New      PT LONG TERM GOAL #4   Title walk all distances without difficulty    Time 8    Period Weeks    Status New                  Plan - 10/06/19 1653    Clinical Impression Statement Patient reports that he has been having right hip and buttock pain since May.  He is unsure of a cause.  Hips x-ray was negative.  MRI of the back shows DDD  and stenosis at L4-5-S1.  He denies any numbness or tingling, reports the biggest issue is walking, cannot walk > 5 minutes due to pain up to 8/10.  has a slight toe out gait and reports that he cannot do stairs step over step due to weakness and pain.  Very tedner in the right buttock, very tight and painful right piriformis mm.    Stability/Clinical Decision Making Stable/Uncomplicated    Clinical Decision Making Low    Rehab Potential Good    PT Frequency 2x / week    PT Duration 8 weeks    PT Treatment/Interventions  ADLs/Self Care Home Management;Electrical Stimulation;Moist Heat;Traction;Therapeutic activities;Therapeutic exercise;Neuromuscular re-education;Manual techniques;Compression bandaging    PT Next Visit Plan may try traction    Consulted and Agree with Plan of Care Patient           Patient will benefit from skilled therapeutic intervention in order to improve the following deficits and impairments:  Abnormal gait, Decreased range of motion, Difficulty walking, Increased muscle spasms, Pain, Impaired flexibility, Decreased strength, Decreased mobility  Visit Diagnosis: Acute right-sided low back pain with right-sided sciatica - Plan: PT plan of care cert/re-cert  Difficulty in walking, not elsewhere classified - Plan: PT plan of care cert/re-cert     Problem List Patient Active Problem List   Diagnosis Date Noted  . Malignant neoplasm of prostate ([place]) 12/14/2013  . HTN (hypertension) 02/24/2011  . CHF (congestive heart failure) ([place]) 02/24/2011  . AS (aortic stenosis) 10/28/2010  . CAD (coronary artery disease) 10/28/2010  . Hyperlipidemia 10/28/2010  . Fatigue 10/28/2010    Sumner Boast., PT 10/06/2019, 5:02 PM  [address]. [place], [place], [address] Phone: [phone]   Fax:  [phone]  Name: Jose Petty MRN: [id number] Date of Birth: November 09, 1940

## 2019-10-06 NOTE — Patient Instructions (Signed)
Access Code: [email or web address] URL: [email or web address] Date: 10/06/2019 Prepared by: Lum Babe  Exercises Hooklying Single Knee to Chest Stretch - 2 x daily - 7 x weekly - 1 sets - 10 reps - 10 hold Supine Double Knee to Chest Modified - 2 x daily - 7 x weekly - 1 sets - 10 reps - 10 hold Supine Piriformis Stretch Pulling Heel to Hip - 2 x daily - 7 x weekly - 1 sets - 10 reps - 10 hold Self Traction Sitting - 2 x daily - 7 x weekly - 1 sets - 3 reps - 10 hold

## 2019-10-11 ENCOUNTER — Ambulatory Visit: Payer: Medicare Other | Attending: Physical Medicine and Rehabilitation | Admitting: Physical Therapy

## 2019-10-11 ENCOUNTER — Encounter: Payer: Self-pay | Admitting: Physical Therapy

## 2019-10-11 ENCOUNTER — Other Ambulatory Visit: Payer: Self-pay

## 2019-10-11 DIAGNOSIS — R262 Difficulty in walking, not elsewhere classified: Secondary | ICD-10-CM | POA: Insufficient documentation

## 2019-10-11 DIAGNOSIS — M5441 Lumbago with sciatica, right side: Secondary | ICD-10-CM | POA: Insufficient documentation

## 2019-10-11 NOTE — Therapy (Signed)
[facility]. [place], [place], [address] Phone: [phone]   Fax:  [phone]  Physical Therapy Treatment  Patient Details  Name: Jose Petty MRN: [id number] Date of Birth: Apr 27, 1940 Referring Provider (PT): Nelva Bush   Encounter Date: 10/11/2019   PT End of Session - 10/11/19 1048    Visit Number 2    Date for PT Re-Evaluation 12/06/19    PT Start Time 7915    PT Stop Time 1100    PT Time Calculation (min) 45 min    Activity Tolerance Patient tolerated treatment well    Behavior During Therapy [facility] for tasks assessed/performed           Past Medical History:  Diagnosis Date  . Adenocarcinoma of prostate ([place]) 11/11/13   Gleason 7  . Allergy   . Aortic stenosis    Moderate per echo in August of 2011.   . Arthritis    thumbs  . Atrial fibrillation ([place])   . Cancer ([place])   . Coronary artery disease    Minimal per cath in 2010  . Fatigue    Chronic  . GERD (gastroesophageal reflux disease)   . Glaucoma   . Hiatal hernia 2006   EGD   . Hyperlipidemia   . Hypertension   . LBBB (left bundle branch block)    Has dyssynchrony but not felt to be significant enough to warrant resynchronization therapy per [facility].   . PVC's (premature ventricular contractions)   . Retinal detachment   . S/P AV nodal ablation 2008 AND 2009  . Stricture and stenosis of esophagus 2006   EGD  . SVT (supraventricular tachycardia) (HCC)     Past Surgical History:  Procedure Laterality Date  . CARDIAC CATHETERIZATION  01/27/2008   EF 45%; 30% prox LAD, 20 to 30% RCA  . CARDIAC CATHETERIZATION  07/28/2003  . CARDIAC ELECTROPHYSIOLOGY STUDY AND ABLATION    . cataracts     both eyes  . HERNIA REPAIR    . PROSTATE BIOPSY  11/11/13   gleason 7, volume 36.94 cc  . US ECHOCARDIOGRAPHY  08/20/2009   EF 55-60%; Moderate LVH, septal bounce consistent with LBBB, calcified aortic valve with moderate AS/AI    There were no vitals  filed for this visit.   Subjective Assessment - 10/11/19 1016    Subjective "I am doing ok"    Currently in Pain? No/denies                             [facility] Adult PT Treatment/Exercise - 10/11/19 0001      Exercises   Exercises Lumbar      Lumbar Exercises: Aerobic   Nustep L4 x 6 min       Lumbar Exercises: Machines for Strengthening   Cybex Knee Extension 5lb 2x10     Cybex Knee Flexion 20lb 2x10       Lumbar Exercises: Standing   Row Theraband;20 reps;Both    Theraband Level (Row) Level 3 (Green)    Shoulder Extension Theraband;20 reps;Both;Strengthening    Theraband Level (Shoulder Extension) Level 3 (Green)      Lumbar Exercises: Seated   Sit to Stand 5 reps   x2     Lumbar Exercises: Supine   Bridge Compliant;10 reps;2 seconds    Other Supine Lumbar Exercises LE on pball bridges, K2C, Oblq      Modalities   Modalities Traction  Traction   Type of Traction Lumbar    Min (lbs) 65    Max (lbs) 75    Hold Time 60    Rest Time 20    Time 12                    PT Short Term Goals - 10/11/19 1051      PT SHORT TERM GOAL #1   Title independent with intial HEP    Status Partially Met             PT Long Term Goals - 10/11/19 1051      PT LONG TERM GOAL #1   Title decrease pain 50%    Status On-going                 Plan - 10/11/19 1048    Clinical Impression Statement Pt tolerated an initial progression to TE well. He reports that he "Could feel it" in his RLE after NuStep warm up. Cues needed to distribute weight evenly with sit to stance. Postural cue needed for rows and extensions. Cue needed to complete the full RO with seated curls and extensions. Tried an initial trial of lumbar traction without issue.    Stability/Clinical Decision Making Stable/Uncomplicated    Rehab Potential Good    PT Frequency 2x / week    PT Duration 8 weeks    PT Treatment/Interventions ADLs/Self Care Home Management;Electrical  Stimulation;Moist Heat;Traction;Therapeutic activities;Therapeutic exercise;Neuromuscular re-education;Manual techniques;Compression bandaging    PT Next Visit Plan Assess Tx           Patient will benefit from skilled therapeutic intervention in order to improve the following deficits and impairments:  Abnormal gait, Decreased range of motion, Difficulty walking, Increased muscle spasms, Pain, Impaired flexibility, Decreased strength, Decreased mobility  Visit Diagnosis: Difficulty in walking, not elsewhere classified     Problem List Patient Active Problem List   Diagnosis Date Noted  . Malignant neoplasm of prostate ([place]) 12/14/2013  . HTN (hypertension) 02/24/2011  . CHF (congestive heart failure) ([place]) 02/24/2011  . AS (aortic stenosis) 10/28/2010  . CAD (coronary artery disease) 10/28/2010  . Hyperlipidemia 10/28/2010  . Fatigue 10/28/2010    Scot Jun 10/11/2019, 10:52 AM  [place]. [place], [place], [address] Phone: [phone]   Fax:  [phone]  Name: Jose Petty MRN: [id number] Date of Birth: 1940-06-18

## 2019-10-19 ENCOUNTER — Encounter: Payer: Medicare Other | Admitting: Physical Therapy

## 2019-10-20 ENCOUNTER — Encounter: Payer: Self-pay | Admitting: Physical Therapy

## 2019-10-20 ENCOUNTER — Ambulatory Visit: Payer: Medicare Other | Admitting: Physical Therapy

## 2019-10-20 ENCOUNTER — Other Ambulatory Visit: Payer: Self-pay

## 2019-10-20 DIAGNOSIS — R262 Difficulty in walking, not elsewhere classified: Secondary | ICD-10-CM | POA: Diagnosis not present

## 2019-10-20 DIAGNOSIS — M5441 Lumbago with sciatica, right side: Secondary | ICD-10-CM

## 2019-10-20 NOTE — Therapy (Signed)
[place]. [place], [place], [address] Phone: [phone]   Fax:  [phone]  Physical Therapy Treatment  Patient Details  Name: Jose Petty MRN: [id number] Date of Birth: 1940-03-09 Referring Provider (PT): Nelva Bush   Encounter Date: 10/20/2019   PT End of Session - 10/20/19 1043    Visit Number 4    Date for PT Re-Evaluation 12/06/19    PT Start Time 9767    PT Stop Time 1100    PT Time Calculation (min) 45 min    Activity Tolerance Patient tolerated treatment well    Behavior During Therapy [facility] for tasks assessed/performed           Past Medical History:  Diagnosis Date  . Adenocarcinoma of prostate ([place]) 11/11/13   Gleason 7  . Allergy   . Aortic stenosis    Moderate per echo in August of 2011.   . Arthritis    thumbs  . Atrial fibrillation ([place])   . Cancer ([place])   . Coronary artery disease    Minimal per cath in 2010  . Fatigue    Chronic  . GERD (gastroesophageal reflux disease)   . Glaucoma   . Hiatal hernia 2006   EGD   . Hyperlipidemia   . Hypertension   . LBBB (left bundle branch block)    Has dyssynchrony but not felt to be significant enough to warrant resynchronization therapy per [facility].   . PVC's (premature ventricular contractions)   . Retinal detachment   . S/P AV nodal ablation 2008 AND 2009  . Stricture and stenosis of esophagus 2006   EGD  . SVT (supraventricular tachycardia) (HCC)     Past Surgical History:  Procedure Laterality Date  . CARDIAC CATHETERIZATION  01/27/2008   EF 45%; 30% prox LAD, 20 to 30% RCA  . CARDIAC CATHETERIZATION  07/28/2003  . CARDIAC ELECTROPHYSIOLOGY STUDY AND ABLATION    . cataracts     both eyes  . HERNIA REPAIR    . PROSTATE BIOPSY  11/11/13   gleason 7, volume 36.94 cc  . US ECHOCARDIOGRAPHY  08/20/2009   EF 55-60%; Moderate LVH, septal bounce consistent with LBBB, calcified aortic valve with moderate AS/AI    There were no vitals  filed for this visit.   Subjective Assessment - 10/20/19 1013    Subjective Feeling pretty good today    Limitations Standing;Walking;House hold activities    How long can you walk comfortably? 5 minutes    Patient Stated Goals have less pain    Currently in Pain? No/denies                             [facility] Adult PT Treatment/Exercise - 10/20/19 0001      Lumbar Exercises: Aerobic   Nustep L4 x 6 min       Lumbar Exercises: Machines for Strengthening   Cybex Knee Extension 10lb 2x10     Cybex Knee Flexion 25lb 2x10     Other Lumbar Machine Exercise Rows and Lats 25lb 2x10     Other Lumbar Machine Exercise Shoulder Ext 5lb 2x10       Traction   Type of Traction Lumbar    Min (lbs) 65    Max (lbs) 75    Hold Time 60    Rest Time 20    Time 12  PT Short Term Goals - 10/11/19 1051      PT SHORT TERM GOAL #1   Title independent with intial HEP    Status Partially Met             PT Long Term Goals - 10/11/19 1051      PT LONG TERM GOAL #1   Title decrease pain 50%    Status On-going                 Plan - 10/20/19 1045    Clinical Impression Statement Pt did well today. Progressed to more machine level interventions. Postural cues needed with seated rows and lats. Come core weakness present with standing shoulder extensions. Cues to complete the full ROM with seated curls and extensions.    Stability/Clinical Decision Making Stable/Uncomplicated    PT Frequency 2x / week    PT Duration 8 weeks    PT Treatment/Interventions ADLs/Self Care Home Management;Electrical Stimulation;Moist Heat;Traction;Therapeutic activities;Therapeutic exercise;Neuromuscular re-education;Manual techniques;Compression bandaging    PT Next Visit Plan Assess Tx, postural and core strength           Patient will benefit from skilled therapeutic intervention in order to improve the following deficits and impairments:  Abnormal gait,  Decreased range of motion, Difficulty walking, Increased muscle spasms, Pain, Impaired flexibility, Decreased strength, Decreased mobility  Visit Diagnosis: Difficulty in walking, not elsewhere classified  Acute right-sided low back pain with right-sided sciatica     Problem List Patient Active Problem List   Diagnosis Date Noted  . Malignant neoplasm of prostate ([place]) 12/14/2013  . HTN (hypertension) 02/24/2011  . CHF (congestive heart failure) ([place]) 02/24/2011  . AS (aortic stenosis) 10/28/2010  . CAD (coronary artery disease) 10/28/2010  . Hyperlipidemia 10/28/2010  . Fatigue 10/28/2010    Scot Jun 10/20/2019, 10:48 AM  [place]. [place], [place], [address] Phone: [phone]   Fax:  [phone]  Name: Jose Petty MRN: [id number] Date of Birth: 1940/09/16

## 2019-10-27 ENCOUNTER — Encounter: Payer: Medicare Other | Admitting: Physical Therapy

## 2019-11-03 ENCOUNTER — Ambulatory Visit: Payer: Medicare Other | Admitting: Physical Therapy

## 2020-01-11 ENCOUNTER — Other Ambulatory Visit: Payer: Self-pay | Admitting: Nurse Practitioner

## 2020-01-11 DIAGNOSIS — Z86718 Personal history of other venous thrombosis and embolism: Secondary | ICD-10-CM

## 2020-04-06 DEATH — deceased

## 2020-06-14 IMAGING — NM NM BONE WHOLE BODY
2 series · 2 of 2 positions shown · non-contrast
Comparison: CT of the pelvis 11/29/2018.

CLINICAL DATA: History of recurrent prostate carcinoma.

EXAM:
NUCLEAR MEDICINE WHOLE BODY BONE SCAN
TECHNIQUE: Whole body anterior and posterior images were obtained approximately
3 hours after intravenous injection of radiopharmaceutical.
RADIOPHARMACEUTICALS:  21.1 mCi 8echnetium-FFm MDP IV

[Series 1: wbr_bone_40 whole body · 2.66mm/px · 1 of 1 slices shown (1 of 2)]
[im 1/1]
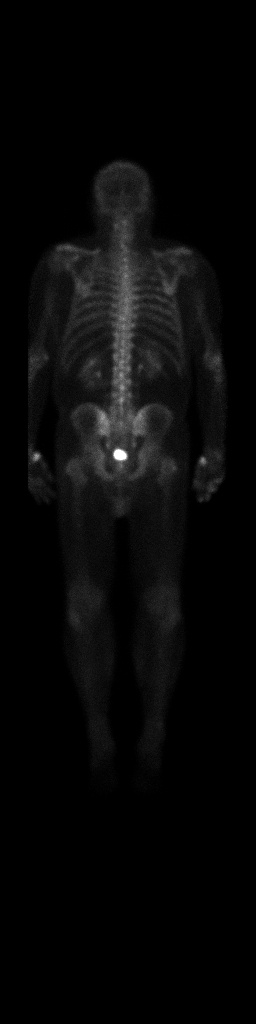

[Series 1: wbr_bone_40 whole body · 2.66mm/px · 1 of 1 slices shown (2 of 2)]
[im 1/1]
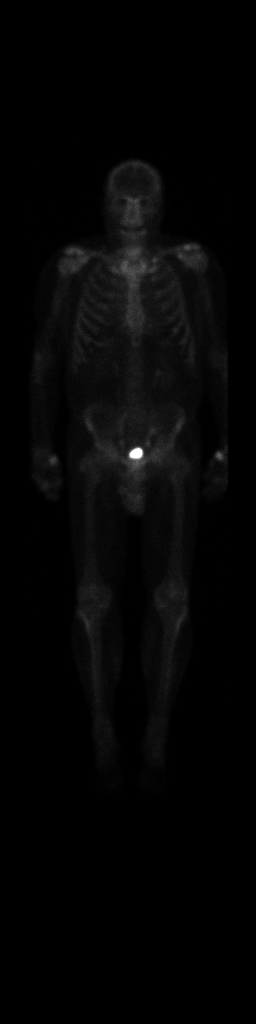

[2 of 2 positions shown; findings below may reference images not displayed]

FINDINGS: No uptake to suggest metastatic disease identified. Mild
degenerative change is present the shoulders and is degenerative
uptake at first CMC joints. Soft tissue uptake is normal.
IMPRESSION: Negative for metastatic disease.
